# Patient Record
Sex: Female | Born: 1985 | Race: Black or African American | Hispanic: No | Marital: Single | Smoking: Never smoker
Health system: Southern US, Community
[De-identification: ages and names within clinical notes are randomized; demographics above are authoritative.]

## PROBLEM LIST (undated history)

## (undated) DIAGNOSIS — T7840XA Allergy, unspecified, initial encounter: Secondary | ICD-10-CM

## (undated) DIAGNOSIS — F329 Major depressive disorder, single episode, unspecified: Secondary | ICD-10-CM

## (undated) DIAGNOSIS — J45909 Unspecified asthma, uncomplicated: Secondary | ICD-10-CM

## (undated) DIAGNOSIS — F32A Depression, unspecified: Secondary | ICD-10-CM

## (undated) HISTORY — PX: BREAST SURGERY: SHX581

## (undated) HISTORY — DX: Depression, unspecified: F32.A

## (undated) HISTORY — DX: Allergy, unspecified, initial encounter: T78.40XA

## (undated) HISTORY — DX: Major depressive disorder, single episode, unspecified: F32.9

## (undated) HISTORY — DX: Unspecified asthma, uncomplicated: J45.909

---

## 2012-12-07 ENCOUNTER — Ambulatory Visit: Payer: BC Managed Care – PPO

## 2012-12-07 ENCOUNTER — Ambulatory Visit (INDEPENDENT_AMBULATORY_CARE_PROVIDER_SITE_OTHER): Payer: BC Managed Care – PPO | Admitting: Family Medicine

## 2012-12-07 VITALS — BP 122/74 | HR 118 | Temp 100.0°F | Resp 16 | Ht 64.0 in | Wt 198.0 lb

## 2012-12-07 DIAGNOSIS — J209 Acute bronchitis, unspecified: Secondary | ICD-10-CM

## 2012-12-07 DIAGNOSIS — R0602 Shortness of breath: Secondary | ICD-10-CM

## 2012-12-07 DIAGNOSIS — J029 Acute pharyngitis, unspecified: Secondary | ICD-10-CM

## 2012-12-07 DIAGNOSIS — R509 Fever, unspecified: Secondary | ICD-10-CM

## 2012-12-07 LAB — POCT RAPID STREP A (OFFICE): Rapid Strep A Screen: NEGATIVE

## 2012-12-07 LAB — POCT CBC
Granulocyte percent: 86.2 % — AB (ref 37–80)
HCT, POC: 35.4 % — AB (ref 37.7–47.9)
Hemoglobin: 11.1 g/dL — AB (ref 12.2–16.2)
Lymph, poc: 1.3 (ref 0.6–3.4)
MCH, POC: 25.9 pg — AB (ref 27–31.2)
MCHC: 31.4 g/dL — AB (ref 31.8–35.4)
MCV: 82.5 fL (ref 80–97)
MID (cbc): 0.6 (ref 0–0.9)
MPV: 9.3 fL (ref 0–99.8)
POC Granulocyte: 11.6 — AB (ref 2–6.9)
POC LYMPH PERCENT: 9.4 %L — AB (ref 10–50)
POC MID %: 4.4 % (ref 0–12)
Platelet Count, POC: 312 10*3/uL (ref 142–424)
RBC: 4.29 M/uL (ref 4.04–5.48)
RDW, POC: 17.4 %
WBC: 13.4 10*3/uL — AB (ref 4.6–10.2)

## 2012-12-07 LAB — POCT UA - MICROSCOPIC ONLY
Casts, Ur, LPF, POC: NEGATIVE
Crystals, Ur, HPF, POC: NEGATIVE
Yeast, UA: NEGATIVE

## 2012-12-07 LAB — POCT URINALYSIS DIPSTICK
Bilirubin, UA: NEGATIVE
Glucose, UA: NEGATIVE
Ketones, UA: 15
Nitrite, UA: NEGATIVE
Protein, UA: NEGATIVE
Spec Grav, UA: 1.025
Urobilinogen, UA: 0.2
pH, UA: 6

## 2012-12-07 LAB — POCT INFLUENZA A/B
Influenza A, POC: NEGATIVE
Influenza B, POC: NEGATIVE

## 2012-12-07 MED ORDER — AZITHROMYCIN 250 MG PO TABS: ORAL_TABLET | ORAL | Status: DC

## 2012-12-07 NOTE — Progress Notes (Signed)
[facility]:  Office Visit  Chief Complaint:  Chief Complaint  Patient presents with  . Fever    x 1 day  . Generalized Body Aches    x 1 day     HPI: Sylvia Parker is a 27 y.o. female who complains of  Sore throat, body aches, nausea, stomach upset, SOB and wheezing x 1 day. + fever. Tried Nyquil without relief.  No cough, no leg swelling, not on birth control. No facial pain or drainage. Risk factor for DVT is that she traveled to [place] by plane  last week, Just moved from [place] from [place]. Recently taking more albuterol. Has not tried any tylenol or motrin   Past Medical History  Diagnosis Date  . Allergy   . Asthma    Past Surgical History  Procedure Laterality Date  . Breast surgery     History   Social History  . Marital Status: Single    Spouse Name: N/A    Number of Children: N/A  . Years of Education: N/A   Social History Main Topics  . Smoking status: Never Smoker   . Smokeless tobacco: None  . Alcohol Use: No  . Drug Use: No  . Sexually Active: None   Other Topics Concern  . None   Social History Narrative  . None   Family History  Problem Relation Age of Onset  . Kidney failure Father   . Cancer Maternal Grandfather   . Cancer Paternal Grandfather    No Known Allergies Prior to Admission medications   Medication Sig Start Date End Date Taking? Authorizing Provider  albuterol (PROVENTIL HFA;VENTOLIN HFA) 108 (90 BASE) MCG/ACT inhaler Inhale 2 puffs into the lungs as needed for wheezing.   Yes Historical Provider, MD     ROS: The patient denies chills, night sweats, unintentional weight loss, chest pain, palpitations, vomiting, abdominal pain, dysuria, hematuria, melena, numbness,  or tingling.  All other systems have been reviewed and were otherwise negative with the exception of those mentioned in the HPI and as above.    PHYSICAL EXAM: Filed Vitals:   12/07/12 0840  BP: 122/74  Pulse: 118  Temp: 100 F  (37.8 C)  Resp: 16  Spo2       96% Filed Vitals:   12/07/12 0840  Height: 5\' 4"  (1.626 m)  Weight: 198 lb (89.812 kg)   Body mass index is 33.97 kg/(m^2).  General: Alert, no acute distress, obese AA female HEENT:  Normocephalic, atraumatic, oropharynx patent. No exudates, TM nl. EOMI, PERRLA Cardiovascular:  Regular rate and rhythm, no rubs murmurs or gallops.  No Carotid bruits, radial pulse intact. No pedal edema.  Respiratory: Clear to auscultation bilaterally.  No wheezes, rales, or rhonchi.  No cyanosis, no use of accessory musculature GI: No organomegaly, abdomen is soft and non-tender, positive bowel sounds.  No masses. Skin: No rashes. Neurologic: Facial musculature symmetric. Psychiatric: Patient is appropriate throughout our interaction. Lymphatic: No cervical lymphadenopathy Musculoskeletal: Gait intact.   LABS: Results for orders placed in visit on 12/07/12  POCT INFLUENZA A/B      Result Value Range   Influenza A, POC Negative     Influenza B, POC Negative    POCT RAPID STREP A (OFFICE)      Result Value Range   Rapid Strep A Screen Negative  Negative  POCT CBC      Result Value Range   WBC 13.4 (*) 4.6 - 10.2 K/uL   Lymph,  poc 1.3  0.6 - 3.4   POC LYMPH PERCENT 9.4 (*) 10 - 50 %L   MID (cbc) 0.6  0 - 0.9   POC MID % 4.4  0 - 12 %M   POC Granulocyte 11.6 (*) 2 - 6.9   Granulocyte percent 86.2 (*) 37 - 80 %G   RBC 4.29  4.04 - 5.48 M/uL   Hemoglobin 11.1 (*) 12.2 - 16.2 g/dL   HCT, POC 16.1 (*) 09.6 - 47.9 %   MCV 82.5  80 - 97 fL   MCH, POC 25.9 (*) 27 - 31.2 pg   MCHC 31.4 (*) 31.8 - 35.4 g/dL   RDW, POC 04.5     Platelet Count, POC 312  142 - 424 K/uL   MPV 9.3  0 - 99.8 fL     EKG/XRAY:   Primary read interpreted by Dr. Conley Rolls at [facility] + increase vasc marking, bronchitic changes   ASSESSMENT/PLAN: Encounter Diagnoses  Name Primary?  . Fever, unspecified   . Acute pharyngitis   . SOB (shortness of breath)   . Acute bronchitis Yes   Repeat  Temp after getting motrin 600 mgx 1 in office. Temp 99.7 1. Rx azithromycin 2. Monitor for worsening sxs, d/w s/sx of DVT 3. Ibuprofen and MOtrin 4. C/w albuterol prn Gross sideeffects, risk and benefits, and alternatives of medications d/w patient. Patient is aware that all medications have potential sideeffects and we are unable to predict every sideeffect or drug-drug interaction that may occur. Work note given for her partner Lavina Hamman for today off 12/07/2012. F/u prn if worsening sxs. Go to ER prn    Helyn Schwan PHUONG, DO 12/07/2012 10:56 AM

## 2012-12-07 NOTE — Patient Instructions (Signed)

## 2014-04-20 ENCOUNTER — Ambulatory Visit (INDEPENDENT_AMBULATORY_CARE_PROVIDER_SITE_OTHER): Payer: BC Managed Care – PPO | Admitting: Psychiatry

## 2014-04-20 ENCOUNTER — Encounter (HOSPITAL_COMMUNITY): Payer: Self-pay | Admitting: Psychiatry

## 2014-04-20 ENCOUNTER — Encounter (INDEPENDENT_AMBULATORY_CARE_PROVIDER_SITE_OTHER): Payer: Self-pay

## 2014-04-20 VITALS — BP 135/73 | HR 89 | Ht 62.0 in | Wt 203.4 lb

## 2014-04-20 DIAGNOSIS — F411 Generalized anxiety disorder: Secondary | ICD-10-CM

## 2014-04-20 DIAGNOSIS — Z0000 Encounter for general adult medical examination without abnormal findings: Secondary | ICD-10-CM

## 2014-04-20 DIAGNOSIS — F5105 Insomnia due to other mental disorder: Secondary | ICD-10-CM | POA: Insufficient documentation

## 2014-04-20 DIAGNOSIS — F331 Major depressive disorder, recurrent, moderate: Secondary | ICD-10-CM

## 2014-04-20 DIAGNOSIS — F418 Other specified anxiety disorders: Secondary | ICD-10-CM | POA: Insufficient documentation

## 2014-04-20 MED ORDER — SERTRALINE HCL 50 MG PO TABS: 50.0000 mg | ORAL_TABLET | Freq: Every day | ORAL | Status: DC

## 2014-04-20 MED ORDER — HYDROXYZINE PAMOATE 25 MG PO CAPS: ORAL_CAPSULE | ORAL | Status: DC

## 2014-04-20 NOTE — Progress Notes (Signed)
Psychiatric Assessment Adult  Patient Identification:  Sylvia Parker Date of Evaluation:  04/20/2014 Chief Complaint: I am depressed History of Chief Complaint:   Chief Complaint  Patient presents with  . Depression    HPI Comments: States she has been depressed for years but it is getting worse lately. She is tired of feeling this way and is looking for help. Typically deals with it by pushing it to the back of her mind and ignoring it. Her dad passed in 2007 and since then it has been harder to ignore. She has never been treated for depression. Pt is depressed near daily and has trouble sometimes getting out of bed. One day a week she tries to get ouf the house and it helps her to feel better. Reports low motivation, anhedonia, isolation, withdrawn, crying spells. Reports worthlessness and low self esteem. Sleeping about 3 hrs/night. Appetite and energy are low. Denies SI/HI, AVH.   Review of Systems Physical Exam  Psychiatric: Her behavior is normal. Judgment and thought content normal. Her mood appears anxious. Cognition and memory are normal. She exhibits a depressed mood.    Depressive Symptoms: depressed mood, anhedonia, insomnia, fatigue, feelings of worthlessness/guilt, difficulty concentrating, hopelessness, decreased appetite,  (Hypo) Manic Symptoms:   Elevated Mood:  No Irritable Mood:  Yes Grandiosity:  No Distractibility:  No Labiality of Mood:  No Delusions:  No Hallucinations:  No Impulsivity:  No Sexually Inappropriate Behavior:  No Financial Extravagance:  No Flight of Ideas:  No  Anxiety Symptoms: Excessive Worry:  Yes racing thoughts and everything makes her nervous. Interfers with sleep and causes fatigue, GI upset and headaches. States she has 5000 thoughts in her head and she can't focus on anything. Causes palpations and sweating.  Panic Symptoms:  No Agoraphobia:  No Obsessive Compulsive: No  Symptoms: None, Specific Phobias:  No Social  Anxiety:  No  Psychotic Symptoms:  Hallucinations: No None Delusions:  No Paranoia:  Yes  Feels that people are watching her and whispering about her. Thinks they think that she is fat, ugly and wears ugle clothes.  Ideas of Reference:  No  PTSD Symptoms: Ever had a traumatic exposure:  No Had a traumatic exposure in the last month:  No Re-experiencing: No None Hypervigilance:  No Hyperarousal: No None Avoidance: No None  Traumatic Brain Injury: No   Past Psychiatric History: Diagnosis: deniies  Hospitalizations: denies  Outpatient Care: denies  Substance Abuse Care: denies  Self-Mutilation: denies  Suicidal Attempts: 10 yrs ago she took a bottle of pain killers and had stomach pumped. Denies access to guns  Violent Behaviors: denies   Past Medical History:   Past Medical History  Diagnosis Date  . Allergy   . Asthma    History of Loss of Consciousness:  No Seizure History:  No Cardiac History:  No Allergies:  No Known Allergies Current Medications:  Current Outpatient Prescriptions  Medication Sig Dispense Refill  . albuterol (PROVENTIL HFA;VENTOLIN HFA) 108 (90 BASE) MCG/ACT inhaler Inhale 2 puffs into the lungs as needed for wheezing.      . pregabalin (LYRICA) 150 MG capsule Take 150 mg by mouth 2 (two) times daily.      Marland Kitchen. azithromycin (ZITHROMAX) 250 MG tablet Take 2 tabs PO now then 1 tab PO daily for the next 4 days  6 tablet  0   No current facility-administered medications for this visit.    Previous Psychotropic Medications:  Medication Dose   denies  Substance Abuse History in the last 12 months: Substance Age of 1st Use Last Use Amount Specific Type  Nicotine  denies        Alcohol  denies        Cannabis  denies        Opiates  denies        Cocaine  denies        Methamphetamines  denies        LSD  denies        Ecstasy  denies         Benzodiazepines  denies        Caffeine     1 coffee 2x/week    Inhalants   denies        Others:  denies                         Medical Consequences of Substance Abuse: denies  Legal Consequences of Substance Abuse: denies  Family Consequences of Substance Abuse: denies  Blackouts:  No DT's:  No Withdrawal Symptoms:  No None  Social History: Current Place of Residence: [place] lives with partner Place of Birth: [place] Family Members: raised by mom and dad separately. Parents divorced when she was 7. Has 2 brother and 2 sisters. Pt is in the middle. States it was hard b/c her dad wasn't there and mom was depressed.  Marital Status:  in relationship for 3 yrs.  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 Legal History: denies Hobbies/Interests: denies  Family History:   Family History  Problem Relation Age of Onset  . Kidney failure Father   . Cancer Maternal Grandfather   . Cancer Paternal Grandfather   . Depression Mother   . Suicidality Neg Hx   . Seizures Neg Hx   . Schizophrenia Neg Hx   . Anxiety disorder Neg Hx     Mental Status Examination/Evaluation: Objective: Attitude: Calm and cooperative  Appearance: Fairly Groomed, appears to be stated age  Patent attorney::  Fair  Speech:  Clear and Coherent and Normal Rate  Volume:  Decreased  Mood:  depressed  Affect:  Congruent and Tearful  Thought Process:  Goal Directed, Linear and Logical  Orientation:  Full (Time, Place, and Person)  Thought Content:  WDL  Suicidal Thoughts:  No  Homicidal Thoughts:  No  Judgement:  Fair  Insight:  Fair  Concentration: good  Memory: Immediate-intact Recent-intact Remote-intact  Recall: fair  Language: fair  Gait and Station: normal  [employer] of Knowledge: average  Psychomotor  Activity:  Normal  Akathisia:  No  Handed:  Right  AIMS (if indicated):  n/a  Assets:  Communication Skills Desire for Improvement Financial Resources/Insurance Housing Intimacy Leisure Time Physical Health Resilience Talents/Skills Transportation Vocational/Educational      Laboratory/X-Ray Psychological Evaluation(s)   order labs none     Assessment:  MDD- moderate, recurrent; GAD  AXIS I Generalized Anxiety Disorder and Major Depression, Recurrent moderate  AXIS II Deferred  AXIS III Past Medical History  Diagnosis Date  . Allergy   . Asthma      AXIS IV other psychosocial or environmental problems and problems with primary support group  AXIS V 51-60 moderate symptoms   Treatment Plan/Recommendations:  Plan of Care:  Medication  management with supportive therapy. Risks/benefits and SE of the medication discussed. Pt verbalized understanding and verbal consent obtained for treatment.  Affirm with the patient that the medications are taken as ordered. Patient expressed understanding of how their medications were to be used.   Confidentiality and exclusions reviewed with pt who verbalized understanding.   Laboratory:  CBC Chemistry Profile HbAIC UDS Vitamin B-12 TSH  Psychotherapy: Therapy: brief supportive therapy provided. Discussed psychosocial stressors in detail.     Medications: Start trial of Zoloft 50mg  po qD for depression and anxiety Start trial of Vistaril 25-50mg  po qHS prn insomnia   Routine PRN Medications:  Yes  Consultations: encouraged to continue individual therapy with Dr. Everardo PacificArlene Cannon every 2 weeks  Encouraged pt to establish care with PCP  Safety Concerns:  Pt denies SI and is at an acute low risk for suicide.Patient told to call clinic if any problems occur. Patient advised to go to ER if they should develop SI/HI, side effects, or if symptoms worsen. Has crisis numbers to call if needed. Pt verbalized understanding.   Other:  F/up  in 2 months or sooner if needed     Oletta DarterAGARWAL, Shaylah Mcghie, MD 10/13/20159:15 AM

## 2014-06-22 ENCOUNTER — Ambulatory Visit (HOSPITAL_COMMUNITY): Payer: BC Managed Care – PPO | Admitting: Psychiatry

## 2014-06-28 ENCOUNTER — Other Ambulatory Visit (HOSPITAL_COMMUNITY): Payer: Self-pay | Admitting: Psychiatry

## 2014-07-08 ENCOUNTER — Encounter (HOSPITAL_COMMUNITY): Payer: Self-pay | Admitting: Psychiatry

## 2014-07-08 ENCOUNTER — Ambulatory Visit (INDEPENDENT_AMBULATORY_CARE_PROVIDER_SITE_OTHER): Payer: BC Managed Care – PPO | Admitting: Psychiatry

## 2014-07-08 VITALS — BP 122/81 | HR 85 | Ht 62.0 in | Wt 199.4 lb

## 2014-07-08 DIAGNOSIS — F331 Major depressive disorder, recurrent, moderate: Secondary | ICD-10-CM

## 2014-07-08 DIAGNOSIS — F411 Generalized anxiety disorder: Secondary | ICD-10-CM

## 2014-07-08 MED ORDER — SERTRALINE HCL 100 MG PO TABS: 100.0000 mg | ORAL_TABLET | Freq: Every day | ORAL | Status: DC

## 2014-07-08 NOTE — Progress Notes (Signed)
[facility] [id number] Progress Note  Sylvia Parker [id number] 28 y.o.  07/08/2014 8:41 AM  Chief Complaint: anxiety is not good  History of Present Illness: Pt had several stressful event that happened at the ned of November. Went to [place] and it was hard with family and seperated temporally from wife. Work remains stressful. States it was causing significant stress, decreased appetite.  Today depression is slowly improving. States meds are helping. Reports crying spells are less frequent. Reports pt is less emotional and less sensitive. At least 1-2x/week she has sad mood. Reports on going anhedonia. Pt is making an effort to be more social. Reports less feelings of worthlessness and hopelessness. Sleep has improved and she is taking Vistaril 3-4x/week and she is getting about 7 hrs. Energy is good. Concentration remains poor but is improving. Appetite is decreased and she is eating once/day. States that is her usual response to stress.   Anxiety is unchanged and she has ongoing racing thoughts.   Taking Zoloft as prescribed and denies SE.  Suicidal Ideation: No Plan Formed: No Patient has means to carry out plan: No  Homicidal Ideation: No Plan Formed: No Patient has means to carry out plan: No  Review of Systems: Psychiatric: Agitation: No Hallucination: No Depressed Mood: Yes Insomnia: No Hypersomnia: No Altered Concentration: Yes Feels Worthless: Yes Grandiose Ideas: No Belief In Special Powers: No New/Increased Substance Abuse: Yes now smoking THC several times a week Compulsions: No  Neurologic: Headache: No Seizure: No Paresthesias: No   Review of Systems  Constitutional: Positive for weight loss. Negative for fever and chills.  HENT: Negative for congestion, nosebleeds and sore throat.   Eyes: Negative for blurred vision, double vision and redness.  Respiratory: Negative for cough, sputum production and shortness of breath.   Cardiovascular:  Negative for chest pain, palpitations and leg swelling.  Gastrointestinal: Negative for heartburn, nausea, vomiting and abdominal pain.  Musculoskeletal: Negative for myalgias, back pain and neck pain.  Skin: Negative for itching and rash.  Neurological: Negative for dizziness, tingling, seizures, loss of consciousness, weakness and headaches.  Psychiatric/Behavioral: Positive for depression. Negative for suicidal ideas, hallucinations and substance abuse. The patient is nervous/anxious. The patient does not have insomnia.      Past Medical Family, Social History: living with wife. No kids. Working at [employer] for last 4 yrs. raised by mom and dad separately. Parents divorced when she was 7. Has 2 brother and 2 sisters. Pt is in the middle. States it was hard b/c her dad wasn't there and mom was depressed.  reports that she has never smoked. She has never used smokeless tobacco. She reports that she drinks alcohol. She reports that she uses illicit drugs (Marijuana).  Family History  Problem Relation Age of Onset  . Kidney failure Father   . Cancer Maternal Grandfather   . Cancer Paternal Grandfather   . Depression Mother   . Suicidality Neg Hx   . Seizures Neg Hx   . Schizophrenia Neg Hx   . Anxiety disorder Neg Hx     Past Medical History  Diagnosis Date  . Allergy   . Asthma    Outpatient Encounter Prescriptions as of 07/08/2014  Medication Sig  . albuterol (PROVENTIL HFA;VENTOLIN HFA) 108 (90 BASE) MCG/ACT inhaler Inhale 2 puffs into the lungs as needed for wheezing.  . hydrOXYzine (VISTARIL) 25 MG capsule Take 25-50mg  po qHS prn insomnia  . pregabalin (LYRICA) 150 MG capsule Take 150 mg by mouth 2 (  two) times daily.  . sertraline (ZOLOFT) 50 MG tablet TAKE 1 TABLET BY MOUTH EVERY DAY    Past Psychiatric History/Hospitalization(s): Anxiety: No Bipolar Disorder: No Depression: No Mania: No Psychosis: No Schizophrenia: No Personality Disorder:  No Hospitalization for psychiatric illness: No History of Electroconvulsive Shock Therapy: No Prior Suicide Attempts: Yes  Physical Exam: Constitutional:  BP 122/81 mmHg  Pulse 85  Ht 5\' 2"[id number]$  (1.575 m)  Wt 199 lb 6.4 oz (90.447 kg)  BMI 36.46 kg/m2  General Appearance: alert, oriented, no acute distress  Musculoskeletal: Strength & Muscle Tone: within normal limits Gait & Station: normal Patient leans: N/A  Mental Status Examination/Evaluation: Objective: Attitude: Calm and cooperative  Appearance: Fairly Groomed and Neat, appears to be stated age  Eye Contact::  Good  Speech:  Clear and Coherent and Normal Rate  Volume:  Normal  Mood:  depressed  Affect:  Full Range  Thought Process:  Goal Directed, Linear and Logical  Orientation:  Full (Time, Place, and Person)  Thought Content:  Negative  Suicidal Thoughts:  No  Homicidal Thoughts:  No  Judgement:  Good  Insight:  Good  Concentration: good  Memory: Immediate-good Recent-good Remote-good  Recall: fair  Language: fair  Gait and Station: normal  [employer] of Knowledge: average  Psychomotor Activity:  Normal  Akathisia:  No  Handed:  Right  AIMS (if indicated): n/a  Assets:  Communication Skills Desire for Improvement Housing Intimacy Social Support Location managerTalents/Skills Transportation Vocational/Educational       Medical Decision Making (Choose Three): Established Problem, Stable/Improving (1), Review of Psycho-Social Stressors (1), Review or order clinical lab tests (1), Established Problem, Worsening (2), Review of Medication Regimen & Side Effects (2) and Review of New Medication or Change in Dosage (2)  Assessment: AXIS I Generalized Anxiety Disorder and Major Depression, Recurrent moderate  AXIS II Deferred  AXIS III Past Medical History  Diagnosis Date  . Allergy   . Asthma      AXIS IV other psychosocial or environmental problems and problems with primary support group  AXIS  V 51-60 moderate symptoms   Treatment Plan/Recommendations:  Plan of Care: Medication management with supportive therapy. Risks/benefits and SE of the medication discussed. Pt verbalized understanding and verbal consent obtained for treatment. Affirm with the patient that the medications are taken as ordered. Patient expressed understanding of how their medications were to be used.    Laboratory: pending  CBC Chemistry Profile HbAIC UDS Vitamin B-12 TSH  Psychotherapy: Therapy: brief supportive therapy provided. Discussed psychosocial stressors in detail.    Medications: Increase Zoloft to 100mg  po qD for depression and anxiety  Vistaril 25-50mg  po qHS prn insomnia   Routine PRN Medications: Yes  Consultations: encouraged to continue individual therapy with Dr. Everardo PacificArlene Cannon every 2 weeks- going well  Encouraged pt to establish care with PCP  Safety Concerns: Pt denies SI and is at an acute low risk for suicide.Patient told to call clinic if any problems occur. Patient advised to go to ER if they should develop SI/HI, side effects, or if symptoms worsen. Has crisis numbers to call if needed. Pt verbalized understanding.   Other: F/up in 3 months or sooner if needed    Oletta DarterAGARWAL, Sylvia Mcinnis, MD 07/08/2014

## 2014-10-07 ENCOUNTER — Ambulatory Visit (INDEPENDENT_AMBULATORY_CARE_PROVIDER_SITE_OTHER): Payer: BLUE CROSS/BLUE SHIELD | Admitting: Psychiatry

## 2014-10-07 ENCOUNTER — Encounter (HOSPITAL_COMMUNITY): Payer: Self-pay | Admitting: Psychiatry

## 2014-10-07 VITALS — BP 122/90 | HR 96 | Ht 62.5 in | Wt 199.8 lb

## 2014-10-07 DIAGNOSIS — F5105 Insomnia due to other mental disorder: Secondary | ICD-10-CM

## 2014-10-07 DIAGNOSIS — F331 Major depressive disorder, recurrent, moderate: Secondary | ICD-10-CM

## 2014-10-07 DIAGNOSIS — F411 Generalized anxiety disorder: Secondary | ICD-10-CM

## 2014-10-07 DIAGNOSIS — F418 Other specified anxiety disorders: Secondary | ICD-10-CM

## 2014-10-07 MED ORDER — HYDROXYZINE PAMOATE 25 MG PO CAPS: ORAL_CAPSULE | ORAL | Status: DC

## 2014-10-07 MED ORDER — SERTRALINE HCL 100 MG PO TABS: 150.0000 mg | ORAL_TABLET | Freq: Every day | ORAL | Status: DC

## 2014-10-07 MED ORDER — BUSPIRONE HCL 5 MG PO TABS: 5.0000 mg | ORAL_TABLET | Freq: Two times a day (BID) | ORAL | Status: DC

## 2014-10-07 NOTE — Progress Notes (Signed)
Patient ID: Sylvia Parker, female   DOB: Aug 11, 1985, 29 y.o.   MRN: [id number]  [facility] [id number] Progress Note  K. Elan Mcelvain [id number] 29 y.o.  10/07/2014 8:36 AM  Chief Complaint: anxiety is getting worse  History of Present Illness: Pt reports stress tolerance is low. Irritability and anxiety are increased. Pt is having nervous feeling, distraction, inability to concentrate, sweating, GI upset and restlessness. These feelings are occurring almost daily.  Pt is having stress induced panic attacks at least once a week.   Today states depression is stable. States meds are helping. Reports crying spells are less frequent. Reports pt is less emotional and less sensitive. At least 1-2x/week she has sad mood. Reports on going anhedonia. Pt is making an effort to be more social. Reports less feelings of worthlessness and hopelessness. Sleep has improved and she is taking Vistaril 3-4x/week and she is getting about 7 hrs. Energy is good. Appetite is decreased even more and she is eating once/day. States that is her usual response to stress.   Taking Zoloft as prescribed and denies SE.   Suicidal Ideation: No Plan Formed: No Patient has means to carry out plan: No  Homicidal Ideation: No Plan Formed: No Patient has means to carry out plan: No  Review of Systems: Psychiatric: Agitation: Yes Hallucination: No Depressed Mood: Yes Insomnia: No Hypersomnia: No Altered Concentration: Yes Feels Worthless: Yes Grandiose Ideas: No Belief In Special Powers: No New/Increased Substance Abuse: No now smoking THC several times a week Compulsions: No  Neurologic: Headache: Yes Seizure: No Paresthesias: No   Review of Systems  Constitutional: Negative for fever, chills and malaise/fatigue.  HENT: Negative for congestion, ear pain, hearing loss, nosebleeds and sore throat.   Eyes: Negative for blurred vision, double vision, photophobia and redness.  Respiratory:  Positive for wheezing. Negative for cough and shortness of breath.   Cardiovascular: Positive for palpitations. Negative for chest pain and leg swelling.  Gastrointestinal: Positive for abdominal pain. Negative for heartburn, nausea and vomiting.  Musculoskeletal: Negative for back pain, joint pain and neck pain.  Skin: Negative for itching and rash.  Neurological: Positive for headaches. Negative for dizziness, tremors, sensory change, seizures and loss of consciousness.  Psychiatric/Behavioral: Positive for depression. Negative for suicidal ideas, hallucinations and substance abuse. The patient is not nervous/anxious.      Past Medical Family, Social History: living with wife. No kids. Working at [employer] for last 4 yrs. raised by mom and dad separately. Parents divorced when she was 7. Has 2 brother and 2 sisters. Pt is in the middle. States it was hard b/c her dad wasn't there and mom was depressed.  reports that she has never smoked. She has never used smokeless tobacco. She reports that she drinks alcohol. She reports that she uses illicit drugs (Marijuana).  Family History  Problem Relation Age of Onset  . Kidney failure Father   . Cancer Maternal Grandfather   . Cancer Paternal Grandfather   . Depression Mother   . Suicidality Neg Hx   . Seizures Neg Hx   . Schizophrenia Neg Hx   . Anxiety disorder Neg Hx     Past Medical History  Diagnosis Date  . Allergy   . Asthma   . Depression    Outpatient Encounter Prescriptions as of 10/07/2014  Medication Sig  . albuterol (PROVENTIL HFA;VENTOLIN HFA) 108 (90 BASE) MCG/ACT inhaler Inhale 2 puffs into the lungs as needed for wheezing.  . hydrOXYzine (VISTARIL) 25  MG capsule Take 25-50mg  po qHS prn insomnia  . pregabalin (LYRICA) 150 MG capsule Take 150 mg by mouth 2 (two) times daily.  . sertraline (ZOLOFT) 100 MG tablet Take 1 tablet (100 mg total) by mouth daily.    Past Psychiatric  History/Hospitalization(s): Anxiety: No Bipolar Disorder: No Depression: No Mania: No Psychosis: No Schizophrenia: No Personality Disorder: No Hospitalization for psychiatric illness: No History of Electroconvulsive Shock Therapy: No Prior Suicide Attempts: Yes  Physical Exam: Constitutional:  BP 122/90 mmHg  Pulse 96  Ht 5' 2.5" (1.588 m)  Wt 199 lb 12.8 oz (90.629 kg)  BMI 35.94 kg/m2  General Appearance: alert, oriented, no acute distress  Musculoskeletal: Strength & Muscle Tone: within normal limits Gait & Station: normal Patient leans: N/A  Mental Status Examination/Evaluation: Objective: Attitude: Calm and cooperative  Appearance: Fairly Groomed and Neat, appears to be stated age  Eye Contact::  Good  Speech:  Clear and Coherent and Normal Rate  Volume:  Normal  Mood: anxious  Affect:  Full Range  Thought Process:  Goal Directed, Linear and Logical  Orientation:  Full (Time, Place, and Person)  Thought Content:  Negative  Suicidal Thoughts:  No  Homicidal Thoughts:  No  Judgement:  Good  Insight:  Good  Concentration: good  Memory: Immediate-good Recent-good Remote-good  Recall: fair  Language: fair  Gait and Station: normal  [employer] of Knowledge: average  Psychomotor Activity:  Normal  Akathisia:  No  Handed:  Right  AIMS (if indicated): n/a  Assets:  Communication Skills Desire for Improvement Housing Intimacy Social Support Location managerTalents/Skills Transportation Vocational/Educational       Medical Decision Making (Choose Three): Review of Psycho-Social Stressors (1), Review or order clinical lab tests (1), Established Problem, Worsening (2), Review of Medication Regimen & Side Effects (2) and Review of New Medication or Change in Dosage (2)  Assessment: AXIS I Generalized Anxiety Disorder and Major Depression, Recurrent moderate  AXIS II Deferred  AXIS III Past Medical History  Diagnosis Date  . Allergy   . Asthma       AXIS IV other psychosocial or environmental problems and problems with primary support group  AXIS V 51-60 moderate symptoms   Treatment Plan/Recommendations:  Plan of Care: Medication management with supportive therapy. Risks/benefits and SE of the medication discussed. Pt verbalized understanding and verbal consent obtained for treatment. Affirm with the patient that the medications are taken as ordered. Patient expressed understanding of how their medications were to be used.    Laboratory: pt reminded to get labs before next visit  CBC Chemistry Profile HbAIC UDS Vitamin B-12 TSH  Psychotherapy: Therapy: brief supportive therapy provided. Discussed psychosocial stressors in detail.    Medications: Increase Zoloft to 150mg  po qD for depression and anxiety Start trial of Buspar 5mg  po BID  Vistaril 25-50mg  po qHS prn insomnia   Routine PRN Medications: Yes  Consultations: encouraged to continue individual therapy with Dr. Everardo PacificArlene Cannon every 2 weeks- going well  Encouraged pt to establish care with PCP  Safety Concerns: Pt denies SI and is at an acute low risk for suicide.Patient told to call clinic if any problems occur. Patient advised to go to ER if they should develop SI/HI, side effects, or if symptoms worsen. Has crisis numbers to call if needed. Pt verbalized understanding.   Other: F/up in 3 months or sooner if needed    Oletta DarterAGARWAL, Karielle Davidow, MD 10/07/2014

## 2014-12-14 ENCOUNTER — Emergency Department (HOSPITAL_BASED_OUTPATIENT_CLINIC_OR_DEPARTMENT_OTHER): Payer: BLUE CROSS/BLUE SHIELD

## 2014-12-14 ENCOUNTER — Emergency Department (HOSPITAL_BASED_OUTPATIENT_CLINIC_OR_DEPARTMENT_OTHER)
Admission: EM | Admit: 2014-12-14 | Discharge: 2014-12-14 | Disposition: A | Discharge status: Home / Self Care | Payer: BLUE CROSS/BLUE SHIELD | Attending: Emergency Medicine | Admitting: Emergency Medicine

## 2014-12-14 ENCOUNTER — Encounter (HOSPITAL_BASED_OUTPATIENT_CLINIC_OR_DEPARTMENT_OTHER): Payer: Self-pay | Admitting: *Deleted

## 2014-12-14 DIAGNOSIS — Z79899 Other long term (current) drug therapy: Secondary | ICD-10-CM | POA: Insufficient documentation

## 2014-12-14 DIAGNOSIS — Y9289 Other specified places as the place of occurrence of the external cause: Secondary | ICD-10-CM | POA: Diagnosis not present

## 2014-12-14 DIAGNOSIS — S99911A Unspecified injury of right ankle, initial encounter: Secondary | ICD-10-CM | POA: Diagnosis present

## 2014-12-14 DIAGNOSIS — S93401A Sprain of unspecified ligament of right ankle, initial encounter: Secondary | ICD-10-CM

## 2014-12-14 DIAGNOSIS — J45909 Unspecified asthma, uncomplicated: Secondary | ICD-10-CM | POA: Diagnosis not present

## 2014-12-14 DIAGNOSIS — F329 Major depressive disorder, single episode, unspecified: Secondary | ICD-10-CM | POA: Diagnosis not present

## 2014-12-14 DIAGNOSIS — W1839XA Other fall on same level, initial encounter: Secondary | ICD-10-CM | POA: Insufficient documentation

## 2014-12-14 DIAGNOSIS — Y9389 Activity, other specified: Secondary | ICD-10-CM | POA: Diagnosis not present

## 2014-12-14 DIAGNOSIS — Y998 Other external cause status: Secondary | ICD-10-CM | POA: Diagnosis not present

## 2014-12-14 MED ORDER — OXYCODONE-ACETAMINOPHEN 5-325 MG PO TABS
2.0000 | ORAL_TABLET | Freq: Once | ORAL | Status: AC
  Administered 2014-12-14: 2 via ORAL
  Filled 2014-12-14: qty 2

## 2014-12-14 MED ORDER — NAPROXEN 500 MG PO TABS: 500.0000 mg | ORAL_TABLET | Freq: Two times a day (BID) | ORAL | Status: DC

## 2014-12-14 MED ORDER — OXYCODONE-ACETAMINOPHEN 5-325 MG PO TABS: 2.0000 | ORAL_TABLET | ORAL | Status: DC | PRN

## 2014-12-14 NOTE — ED Provider Notes (Signed)
CSN: [id number]     Arrival date & time 12/14/14  2100 History   First MD Initiated Contact with Patient 12/14/14 2146     Chief Complaint  Patient presents with  . Fall  . Ankle Pain     (Consider location/radiation/quality/duration/timing/severity/associated sxs/prior Treatment) Patient is a 29 y.o. female presenting with fall and ankle pain. The history is provided by the patient. No language interpreter was used.  Fall Associated symptoms include arthralgias and myalgias. Pertinent negatives include no neck pain, numbness, rash or weakness.  Ankle Pain Associated symptoms: no back pain and no neck pain    Sylvia Parker is a 29 year old female with a history of asthma, depression who presents for ankle pain after stepping into a divot in the grass while chasing her dog at 11:45 AM. She states her foot inverted and then she fell to the ground. She was able to walk after the incident and did not lose consciousness or hit her head but did hobble back to the house. She states that she did not believe she broke her ankle but the pain increased and the swelling in her foot also increased as the day went on. She states the pain is worse with ambulating. She has had no treatment prior to arriving in the ED. She denies any neck pain any back pain any dizziness any head injury, numbness, tingling.  Past Medical History  Diagnosis Date  . Allergy   . Asthma   . Depression    Past Surgical History  Procedure Laterality Date  . Breast surgery     Family History  Problem Relation Age of Onset  . Kidney failure Father   . Cancer Maternal Grandfather   . Cancer Paternal Grandfather   . Depression Mother   . Suicidality Neg Hx   . Seizures Neg Hx   . Schizophrenia Neg Hx   . Anxiety disorder Neg Hx    History  Substance Use Topics  . Smoking status: Never Smoker   . Smokeless tobacco: Never Used  . Alcohol Use: Yes     Comment: occ- 2x/month 1-2 drinks per episode   OB History    No  data available     Review of Systems  Musculoskeletal: Positive for myalgias and arthralgias. Negative for back pain and neck pain.  Skin: Negative for rash and wound.  Neurological: Negative for dizziness, syncope, weakness and numbness.      Allergies  Review of patient's allergies indicates no known allergies.  Home Medications   Prior to Admission medications   Medication Sig Start Date End Date Taking? Authorizing Provider  albuterol (PROVENTIL HFA;VENTOLIN HFA) 108 (90 BASE) MCG/ACT inhaler Inhale 2 puffs into the lungs as needed for wheezing.    Historical Provider, MD  busPIRone (BUSPAR) 5 MG tablet Take 1 tablet (5 mg total) by mouth 2 (two) times daily. 10/07/14   Oletta DarterSalina Agarwal, MD  hydrOXYzine (VISTARIL) 25 MG capsule Take 25-50mg  po qHS prn insomnia 10/07/14   Oletta DarterSalina Agarwal, MD  naproxen (NAPROSYN) 500 MG tablet Take 1 tablet (500 mg total) by mouth 2 (two) times daily. 12/14/14   Rawson Minix Patel-Mills, PA-C  oxyCODONE-acetaminophen (PERCOCET/ROXICET) 5-325 MG per tablet Take 2 tablets by mouth every 4 (four) hours as needed for severe pain. 12/14/14   Mansa Willers Patel-Mills, PA-C  pregabalin (LYRICA) 150 MG capsule Take 150 mg by mouth 2 (two) times daily.    Historical Provider, MD  sertraline (ZOLOFT) 100 MG tablet Take 1.5 tablets (150 mg total) by  mouth daily. 10/07/14   Oletta Darter, MD   BP 132/109 mmHg  Pulse 93  Temp(Src) 99.1 F (37.3 C) (Oral)  Resp 18  Ht  (1.575 m)  Wt 200 lb (90.719 kg)  BMI 36.57 kg/m2  SpO2 98%  LMP 12/10/2014 Physical Exam  Constitutional: She is oriented to person, place, and time. She appears well-developed and well-nourished.  HENT:  Head: Normocephalic and atraumatic.  Eyes: Conjunctivae are normal.  Cardiovascular: Normal rate and regular rhythm.   Pulmonary/Chest: Effort normal.  Musculoskeletal:  Right ankle: She is able to flex and extend the toes. She has a good DP pulse. She has tenderness to palpation of the talus,  navicular, and the fourth and fifth metatarsals. She has moderate edema along the fourth and fifth metatarsals.   No right knee tenderness.  Neurological: She is alert and oriented to person, place, and time.  Skin: Skin is warm and dry.  Nursing note and vitals reviewed.   ED Course  Procedures (including critical care time) Labs Review Labs Reviewed - No data to display  Imaging Review Dg Ankle Complete Right  12/14/2014   CLINICAL DATA:  29 year old with lateral ankle pain after fall earlier today  EXAM: RIGHT ANKLE - COMPLETE 3+ VIEW  COMPARISON:  None.  FINDINGS: There is no evidence of fracture, dislocation, or joint effusion. There is no evidence of arthropathy or other focal bone abnormality. Soft tissues are unremarkable.  IMPRESSION: Negative.   Electronically Signed   By: Malachy Moan M.D.   On: 12/14/2014 21:28     EKG Interpretation None      MDM   Final diagnoses:  Ankle sprain, right, initial encounter  Patient presents after a right ankle injury. Her right ankle x-ray is negative for acute fracture, dislocation, joint effusion. I have given her ice and Percocet in the ED. I discussed rest, ice, elevation. I have given her crutches and a right ankle Aircast.  I gave her ortho follow up in 1 week for continued pain.  I also gave her naproxen for pain and percocet for breakthrough pain.  Patient verbally agrees with the plan.  Sylvia Gosselin, PA-C 12/14/14 1610  Gwyneth Sprout, MD 12/16/14 [phone]

## 2014-12-14 NOTE — ED Notes (Signed)
She stepped in a hole in the grass and fell. Injury to her right ankle.

## 2014-12-14 NOTE — Discharge Instructions (Signed)
Ankle Sprain Rest.ice.elevation. Tried to get off of crutches within 1 week to prevent arm injury. Take Percocet for breakthrough pain. An ankle sprain is an injury to the strong, fibrous tissues (ligaments) that hold the bones of your ankle joint together.  CAUSES An ankle sprain is usually caused by a fall or by twisting your ankle. Ankle sprains most commonly occur when you step on the outer edge of your foot, and your ankle turns inward. People who participate in sports are more prone to these types of injuries.  SYMPTOMS   Pain in your ankle. The pain may be present at rest or only when you are trying to stand or walk.  Swelling.  Bruising. Bruising may develop immediately or within 1 to 2 days after your injury.  Difficulty standing or walking, particularly when turning corners or changing directions. DIAGNOSIS  Your caregiver will ask you details about your injury and perform a physical exam of your ankle to determine if you have an ankle sprain. During the physical exam, your caregiver will press on and apply pressure to specific areas of your foot and ankle. Your caregiver will try to move your ankle in certain ways. An X-ray exam may be done to be sure a bone was not broken or a ligament did not separate from one of the bones in your ankle (avulsion fracture).  TREATMENT  Certain types of braces can help stabilize your ankle. Your caregiver can make a recommendation for this. Your caregiver may recommend the use of medicine for pain. If your sprain is severe, your caregiver may refer you to a surgeon who helps to restore function to parts of your skeletal system (orthopedist) or a physical therapist. HOME CARE INSTRUCTIONS   Apply ice to your injury for 1-2 days or as directed by your caregiver. Applying ice helps to reduce inflammation and pain.  Put ice in a plastic bag.  Place a towel between your skin and the bag.  Leave the ice on for 15-20 minutes at a time, every 2 hours  while you are awake.  Only take over-the-counter or prescription medicines for pain, discomfort, or fever as directed by your caregiver.  Elevate your injured ankle above the level of your heart as much as possible for 2-3 days.  If your caregiver recommends crutches, use them as instructed. Gradually put weight on the affected ankle. Continue to use crutches or a cane until you can walk without feeling pain in your ankle.  If you have a plaster splint, wear the splint as directed by your caregiver. Do not rest it on anything harder than a pillow for the first 24 hours. Do not put weight on it. Do not get it wet. You may take it off to take a shower or bath.  You may have been given an elastic bandage to wear around your ankle to provide support. If the elastic bandage is too tight (you have numbness or tingling in your foot or your foot becomes cold and blue), adjust the bandage to make it comfortable.  If you have an air splint, you may blow more air into it or let air out to make it more comfortable. You may take your splint off at night and before taking a shower or bath. Wiggle your toes in the splint several times per day to decrease swelling. SEEK MEDICAL CARE IF:   You have rapidly increasing bruising or swelling.  Your toes feel extremely cold or you lose feeling in your foot.  Your pain is not relieved with medicine. SEEK IMMEDIATE MEDICAL CARE IF:  Your toes are numb or blue.  You have severe pain that is increasing. MAKE SURE YOU:   Understand these instructions.  Will watch your condition.  Will get help right away if you are not doing well or get worse. Document Released: 06/25/2005 Document Revised: 03/19/2012 Document Reviewed: 07/07/2011 [facility] Patient Information 2015 [place], [place]. This information is not intended to replace advice given to you by your health care provider. Make sure you discuss any questions you have with your health care provider.

## 2015-01-06 ENCOUNTER — Ambulatory Visit (HOSPITAL_COMMUNITY): Payer: Self-pay | Admitting: Psychiatry

## 2015-01-13 ENCOUNTER — Ambulatory Visit (INDEPENDENT_AMBULATORY_CARE_PROVIDER_SITE_OTHER): Payer: BLUE CROSS/BLUE SHIELD | Admitting: Psychiatry

## 2015-01-13 ENCOUNTER — Encounter (HOSPITAL_COMMUNITY): Payer: Self-pay | Admitting: Psychiatry

## 2015-01-13 VITALS — BP 118/74 | HR 77 | Ht 63.0 in | Wt 205.6 lb

## 2015-01-13 DIAGNOSIS — F411 Generalized anxiety disorder: Secondary | ICD-10-CM

## 2015-01-13 DIAGNOSIS — F5105 Insomnia due to other mental disorder: Secondary | ICD-10-CM

## 2015-01-13 DIAGNOSIS — F331 Major depressive disorder, recurrent, moderate: Secondary | ICD-10-CM | POA: Diagnosis not present

## 2015-01-13 DIAGNOSIS — F418 Other specified anxiety disorders: Secondary | ICD-10-CM

## 2015-01-13 MED ORDER — BUPROPION HCL ER (SR) 100 MG PO TB12: 100.0000 mg | ORAL_TABLET | Freq: Two times a day (BID) | ORAL | Status: DC

## 2015-01-13 MED ORDER — SERTRALINE HCL 100 MG PO TABS: 150.0000 mg | ORAL_TABLET | Freq: Every day | ORAL | Status: DC

## 2015-01-13 MED ORDER — HYDROXYZINE PAMOATE 50 MG PO CAPS: ORAL_CAPSULE | ORAL | Status: DC

## 2015-01-13 MED ORDER — BUSPIRONE HCL 5 MG PO TABS: 5.0000 mg | ORAL_TABLET | Freq: Two times a day (BID) | ORAL | Status: DC

## 2015-01-13 NOTE — Progress Notes (Signed)
[facility]  Progress Note  Sylvia Parker [id number] 29 y.o.  01/13/2015 8:07 AM  Chief Complaint: I have been feeling more depressed  History of Present Illness: Patient seen for the first time by Dr. Karie Schwalbe ADD [facility]. Patient sees Dr. Michae KavaAgarwal under not regular basis who is currently only. Patient reports that she works from home and lately her mood has been more depressed with increased anxiety and poor concentration which is affecting her work. Also has insomnia and states that especially she middle and terminal insomnia which is causing her to be tired. Denies panic attacks. Has occasional crying spells, appetite is poor has anhedonia..  Tolerating her medications well and reports no side effects. Discussed increasing her Vistaril and starting her on  Wellbutrin for her depression and concentration and patient gave informed consent after I discussed the rationale risks benefits options Patient states she had her labs done at her PCP and was encouraged to get a report of the labs for as she stated understanding and is willing to do so.    Suicidal Ideation: No Plan Formed: No Patient has means to carry out plan: No  Homicidal Ideation: No Plan Formed: No Patient has means to carry out plan: No  Review of Systems: Psychiatric: Agitation: Yes Hallucination: No Depressed Mood: Yes Insomnia: No Hypersomnia: No Altered Concentration: Yes Feels Worthless: Yes Grandiose Ideas: No Belief In Special Powers: No New/Increased Substance Abuse: No now smoking THC several times a week Compulsions: No  Neurologic: Headache: Yes Seizure: No Paresthesias: No   Review of Systems  Constitutional: Positive for weight loss. Negative for fever, chills and malaise/fatigue.  HENT: Negative for congestion, ear pain, hearing loss, nosebleeds and sore throat.   Eyes: Negative for blurred vision, double vision, photophobia, pain and redness.  Respiratory: Negative for cough,  shortness of breath and wheezing.   Cardiovascular: Negative for chest pain, palpitations and leg swelling.  Gastrointestinal: Positive for constipation. Negative for heartburn, nausea, vomiting and abdominal pain.  Genitourinary: Negative for dysuria, urgency, frequency and hematuria.  Musculoskeletal: Negative for myalgias, back pain, joint pain, falls and neck pain.  Skin: Negative for itching and rash.  Neurological: Positive for headaches. Negative for dizziness, tremors, sensory change, seizures and loss of consciousness.  Endo/Heme/Allergies: Negative for environmental allergies and polydipsia. Does not bruise/bleed easily.  Psychiatric/Behavioral: Positive for depression, memory loss and substance abuse. Negative for suicidal ideas and hallucinations. The patient is nervous/anxious.      Past Medical Family, Social History: living with wife. No kids. Working at [employer] for last 4 yrs. raised by mom and dad separately. Parents divorced when she was 7. Has 2 brother and 2 sisters. Pt is in the middle. States it was hard b/c her dad wasn't there and mom was depressed.  reports that she has never smoked. She has never used smokeless tobacco. She reports that she drinks alcohol. She reports that she uses illicit drugs (Marijuana).  Family History  Problem Relation Age of Onset  . Kidney failure Father   . Cancer Maternal Grandfather   . Cancer Paternal Grandfather   . Depression Mother   . Suicidality Neg Hx   . Seizures Neg Hx   . Schizophrenia Neg Hx   . Anxiety disorder Neg Hx     Past Medical History  Diagnosis Date  . Allergy   . Asthma   . Depression    Outpatient Encounter Prescriptions as of 01/13/2015  Medication Sig  . albuterol (PROVENTIL  HFA;VENTOLIN HFA) 108 (90 BASE) MCG/ACT inhaler Inhale 2 puffs into the lungs as needed for wheezing.  . busPIRone (BUSPAR) 5 MG tablet Take 1 tablet (5 mg total) by mouth 2 (two) times daily.  . hydrOXYzine (VISTARIL) 25  MG capsule Take 25-50mg  po qHS prn insomnia  . naproxen (NAPROSYN) 500 MG tablet Take 1 tablet (500 mg total) by mouth 2 (two) times daily.  Marland Kitchen oxyCODONE-acetaminophen (PERCOCET/ROXICET) 5-325 MG per tablet Take 2 tablets by mouth every 4 (four) hours as needed for severe pain.  . pregabalin (LYRICA) 150 MG capsule Take 150 mg by mouth 2 (two) times daily.  . sertraline (ZOLOFT) 100 MG tablet Take 1.5 tablets (150 mg total) by mouth daily.   No facility-administered encounter medications on file as of 01/13/2015.    Past Psychiatric History/Hospitalization(s): Anxiety: No Bipolar Disorder: No Depression: No Mania: No Psychosis: No Schizophrenia: No Personality Disorder: No Hospitalization for psychiatric illness: No History of Electroconvulsive Shock Therapy: No Prior Suicide Attempts: Yes  Physical Exam: Constitutional:  LMP 12/10/2014  General Appearance: alert, oriented, no acute distress  Musculoskeletal: Strength & Muscle Tone: within normal limits Gait & Station: normal Patient leans: N/A  Mental Status Examination/Evaluation: Objective: Attitude: Calm and cooperative  Appearance: Fairly Groomed and Neat, appears to be stated age  Eye Contact::  Good  Speech:  Clear and Coherent and Normal Rate  Volume:  Normal  Mood: anxious  Affect:  Full Range  Thought Process:  Goal Directed, Linear and Logical  Orientation:  Full (Time, Place, and Person)  Thought Content:  Negative  Suicidal Thoughts:  No  Homicidal Thoughts:  No  Judgement:  Good  Insight:  Good  Concentration: Fair   Memory: Immediate-good Recent-good Remote-good  Recall: fair  Language: fair  Gait and Station: normal  [employer] of Knowledge: average  Psychomotor Activity:  Normal  Akathisia:  No  Handed:  Right  AIMS (if indicated): n/a  Assets:  Communication Skills Desire for Improvement Housing Intimacy Social Support Horticulturist, commercial (Choose Three): Review of Psycho-Social Stressors (1), Review or order clinical lab tests (1), Established Problem, Worsening (2), Review of Medication Regimen & Side Effects (2) and Review of New Medication or Change in Dosage (2)  Assessment: AXIS I Generalized Anxiety Disorder and Major Depression, Recurrent moderate  AXIS II Deferred  AXIS III Past Medical History  Diagnosis Date  . Allergy   . Asthma      AXIS IV other psychosocial or environmental problems and problems with primary support group  AXIS V 51-60 moderate symptoms   Treatment Plan/Recommendations:  Plan of Care: Medication management with supportive therapy. Risks/benefits and SE of the medication discussed. Pt verbalized understanding and verbal consent obtained for treatment. Affirm with the patient that the medications are taken as ordered. Patient expressed understanding of how their medications were to be used.    Laboratory: pt reminded to get labs before next visit  CBC Chemistry Profile HbAIC UDS Vitamin B-12 TSH  Psychotherapy: Therapy: brief supportive therapy provided. Discussed psychosocial stressors in detail.    Medications: Start Wellbutrin SR 100 mg a.m. and at p.m. for depression and concentration Continue Zoloft to 150mg  po qD for depression and anxiety Continue of Buspar 5mg  po BID  Vistaril 25-50mg  po qHS prn insomnia   Routine PRN Medications: Yes  Consultations: encouraged to continue individual therapy with Dr. Everardo Pacific every 2 weeks- going well  Encouraged pt to establish  care with PCP  Safety Concerns: Pt denies SI and is at an acute low risk for suicide.Patient told to call clinic if any problems occur. Patient advised to go to ER if they should develop SI/HI, side effects, or if symptoms worsen. Has crisis numbers to call if needed. Pt verbalized understanding.   Other: F/up in 3 months or sooner if needed     Margit Banda, MD 01/13/2015

## 2015-02-13 ENCOUNTER — Emergency Department (HOSPITAL_BASED_OUTPATIENT_CLINIC_OR_DEPARTMENT_OTHER)
Admission: EM | Admit: 2015-02-13 | Discharge: 2015-02-13 | Disposition: A | Discharge status: Home / Self Care | Payer: BLUE CROSS/BLUE SHIELD | Attending: Emergency Medicine | Admitting: Emergency Medicine

## 2015-02-13 DIAGNOSIS — X58XXXA Exposure to other specified factors, initial encounter: Secondary | ICD-10-CM | POA: Diagnosis not present

## 2015-02-13 DIAGNOSIS — Y9289 Other specified places as the place of occurrence of the external cause: Secondary | ICD-10-CM | POA: Insufficient documentation

## 2015-02-13 DIAGNOSIS — S39012A Strain of muscle, fascia and tendon of lower back, initial encounter: Secondary | ICD-10-CM

## 2015-02-13 DIAGNOSIS — J45909 Unspecified asthma, uncomplicated: Secondary | ICD-10-CM | POA: Insufficient documentation

## 2015-02-13 DIAGNOSIS — Y9389 Activity, other specified: Secondary | ICD-10-CM | POA: Insufficient documentation

## 2015-02-13 DIAGNOSIS — Y998 Other external cause status: Secondary | ICD-10-CM | POA: Diagnosis not present

## 2015-02-13 DIAGNOSIS — S3992XA Unspecified injury of lower back, initial encounter: Secondary | ICD-10-CM | POA: Diagnosis present

## 2015-02-13 DIAGNOSIS — Z79899 Other long term (current) drug therapy: Secondary | ICD-10-CM | POA: Diagnosis not present

## 2015-02-13 DIAGNOSIS — F329 Major depressive disorder, single episode, unspecified: Secondary | ICD-10-CM | POA: Insufficient documentation

## 2015-02-13 MED ORDER — HYDROCODONE-ACETAMINOPHEN 5-325 MG PO TABS
ORAL_TABLET | ORAL | Status: DC
Start: 2015-02-13 — End: 2016-03-31

## 2015-02-13 MED ORDER — PREDNISONE 50 MG PO TABS: ORAL_TABLET | ORAL | Status: DC

## 2015-02-13 MED ORDER — DIAZEPAM 5 MG PO TABS
5.0000 mg | ORAL_TABLET | Freq: Once | ORAL | Status: AC
  Administered 2015-02-13: 5 mg via ORAL
  Filled 2015-02-13: qty 1

## 2015-02-13 MED ORDER — PREDNISONE 50 MG PO TABS
60.0000 mg | ORAL_TABLET | Freq: Once | ORAL | Status: AC
  Administered 2015-02-13: 60 mg via ORAL
  Filled 2015-02-13 (×2): qty 1

## 2015-02-13 MED ORDER — METHOCARBAMOL 500 MG PO TABS: 1000.0000 mg | ORAL_TABLET | Freq: Four times a day (QID) | ORAL | Status: DC | PRN

## 2015-02-13 MED ORDER — MORPHINE SULFATE 4 MG/ML IJ SOLN
4.0000 mg | Freq: Once | INTRAMUSCULAR | Status: AC
  Administered 2015-02-13: 4 mg via INTRAMUSCULAR
  Filled 2015-02-13: qty 1

## 2015-02-13 NOTE — Discharge Instructions (Signed)
Please take ibuprofen 400mg (this is normally 2 over the counter pills) every 6 hours (take with food to minimze stomach irritation).  ° °Take robaxin and/or Vicodin for breakthrough pain, do not drink alcohol, drive, care for children or perfom other critical tasks while taking robaxin and/or Vicodin . ° °Please follow with your primary care doctor in the next 2 days for a check-up. They must obtain records for further management.  ° °Do not hesitate to return to the Emergency Department for any new, worsening or concerning symptoms.  ° °

## 2015-02-13 NOTE — ED Provider Notes (Signed)
CSN: [id number]     Arrival date & time 02/13/15  2154 History  This chart was scribed for non-physician practitioner, Wynetta Emery, PA-C, working with Benjiman Core, MD, by Budd Palmer ED Scribe. This patient was seen in room MH01/MH01 and the patient's care was started at 10:31 PM     Chief Complaint  Patient presents with  . Back Pain   The history is provided by the patient. No language interpreter was used.   HPI Comments: Sylvia Parker is a 29 y.o. female who presents to the Emergency Department complaining of constant, stabbing back pain shooting down to her legs onset earlier today. Pain is 10 out of 10, taking Aleve with no relief. She states she was coughing when she felt a sharp, stabbing pain in her back. She has since been unable to move without pain. She notes associated tingling in both legs.  She has NKDA.  Past Medical History  Diagnosis Date  . Allergy   . Asthma   . Depression    Past Surgical History  Procedure Laterality Date  . Breast surgery     Family History  Problem Relation Age of Onset  . Kidney failure Father   . Cancer Maternal Grandfather   . Cancer Paternal Grandfather   . Depression Mother   . Suicidality Neg Hx   . Seizures Neg Hx   . Schizophrenia Neg Hx   . Anxiety disorder Neg Hx    History  Substance Use Topics  . Smoking status: Never Smoker   . Smokeless tobacco: Never Used  . Alcohol Use: Yes     Comment: occ- 2x/month 1-2 drinks per episode   OB History    No data available     Review of Systems A complete 10 system review of systems was obtained and all systems are negative except as noted in the HPI and PMH.    Allergies  Review of patient's allergies indicates no known allergies.  Home Medications   Prior to Admission medications   Medication Sig Start Date End Date Taking? Authorizing Provider  buPROPion (WELLBUTRIN SR) 100 MG 12 hr tablet Take 1 tablet (100 mg total) by mouth 2 (two) times daily.  01/13/15 01/13/16 Yes Gayland Curry, MD  busPIRone (BUSPAR) 5 MG tablet Take 1 tablet (5 mg total) by mouth 2 (two) times daily. 01/13/15  Yes Gayland Curry, MD  pregabalin (LYRICA) 150 MG capsule Take 150 mg by mouth 2 (two) times daily.   Yes Historical Provider, MD  albuterol (PROVENTIL HFA;VENTOLIN HFA) 108 (90 BASE) MCG/ACT inhaler Inhale 2 puffs into the lungs as needed for wheezing.    Historical Provider, MD  hydrOXYzine (VISTARIL) 50 MG capsule Take 25-50mg  po qHS prn insomnia 01/13/15   Gayland Curry, MD  naproxen (NAPROSYN) 500 MG tablet Take 1 tablet (500 mg total) by mouth 2 (two) times daily. 12/14/14   Hanna Patel-Mills, PA-C  oxyCODONE-acetaminophen (PERCOCET/ROXICET) 5-325 MG per tablet Take 2 tablets by mouth every 4 (four) hours as needed for severe pain. 12/14/14   Hanna Patel-Mills, PA-C  sertraline (ZOLOFT) 100 MG tablet Take 1.5 tablets (150 mg total) by mouth daily. 01/13/15   Gayland Curry, MD   BP 121/72 mmHg  Pulse 109  Temp(Src) 98.5 F (36.9 C) (Oral)  Resp 18  Ht 5\' 2"  (1.575 m)  Wt 205 lb 3 oz (93.072 kg)  BMI 37.52 kg/m2  SpO2 95%  LMP 01/31/2015 (Approximate) Physical Exam  Constitutional: She is oriented  to person, place, and time. She appears well-developed and well-nourished. No distress.  Tearful, appears acutely uncomfortable.  HENT:  Head: Normocephalic.  Eyes: Conjunctivae and EOM are normal.  Neck: Normal range of motion.  Cardiovascular: Normal rate, regular rhythm and intact distal pulses.   Pulmonary/Chest: Effort normal. No stridor.  Abdominal: Soft. There is no tenderness.  Musculoskeletal: Normal range of motion.  Neurological: She is alert and oriented to person, place, and time.  No point tenderness to percussion of lumbar spinal processes.  No TTP or paraspinal muscular spasm. Strength is 5 out of 5 to bilateral lower extremities at hip and knee; extensor hallucis longus 5 out of 5. Ankle strength 5 out of 5, no clonus,  neurovascularly intact. No saddle anaesthesia. Patellar reflexes are 2+ bilaterally.    Straight leg raise is positive on the left side at 40, positive on the right at around 35.   Psychiatric: She has a normal mood and affect.  Nursing note and vitals reviewed.   ED Course  Procedures  DIAGNOSTIC STUDIES: Oxygen Saturation is 95% on RA, adequate by my interpretation.    COORDINATION OF CARE: 10:35 PM - Discussed probable pinched nerve. Discussed plans to order pain medication, anti-inflammatory medication, muscle relaxant, and a steroid. Pt advised of plan for treatment and pt agrees.  Labs Review Labs Reviewed - No data to display  Imaging Review No results found.   EKG Interpretation None      MDM   Final diagnoses:  None    Filed Vitals:   02/13/15 2204  BP: 121/72  Pulse: 109  Temp: 98.5 F (36.9 C)  TempSrc: Oral  Resp: 18  Height:  (1.575 m)  Weight: 205 lb 3 oz (93.072 kg)  SpO2: 95%    Medications  morphine 4 MG/ML injection 4 mg (4 mg Intramuscular Given 02/13/15 2246)  diazepam (VALIUM) tablet 5 mg (5 mg Oral Given 02/13/15 2245)  predniSONE (DELTASONE) tablet 60 mg (60 mg Oral Given 02/13/15 2245)    Sylvia Parker is a pleasant 28 y.o. female presenting with severe low back pain radiating down posterior both eyes.  No neurological deficits and normal neuro exam.  Patient can walk but states is painful.  No loss of bowel or bladder control.  No concern for cauda equina.  No fever, night sweats, weight loss, h/o cancer, IVDU.  RICE protocol and pain medicine indicated and discussed with patient.  Evaluation does not show pathology that would require ongoing emergent intervention or inpatient treatment. Pt is hemodynamically stable and mentating appropriately. Discussed findings and plan with patient/guardian, who agrees with care plan. All questions answered. Return precautions discussed and outpatient follow up given.   New Prescriptions    HYDROCODONE-ACETAMINOPHEN (NORCO/VICODIN) 5-325 MG PER TABLET    Take 1-2 tablets by mouth every 6 hours as needed for pain and/or cough.   METHOCARBAMOL (ROBAXIN) 500 MG TABLET    Take 2 tablets (1,000 mg total) by mouth 4 (four) times daily as needed (Pain).   PREDNISONE (DELTASONE) 50 MG TABLET    Take 1 tablet daily with breakfast    I personally performed the services described in this documentation, which was scribed in my presence. The recorded information has been reviewed and is accurate.    Wynetta Emery, PA-C 02/13/15 9147  Benjiman Core, MD 02/14/15 [phone]

## 2015-02-13 NOTE — ED Notes (Signed)
Presents with back pain in lower back and radiates down both legs-started after coughing hard due to choking. Pt has difficulty standing up straight.

## 2015-04-19 ENCOUNTER — Encounter (HOSPITAL_COMMUNITY): Payer: Self-pay | Admitting: Psychiatry

## 2015-04-19 ENCOUNTER — Ambulatory Visit (INDEPENDENT_AMBULATORY_CARE_PROVIDER_SITE_OTHER): Payer: BLUE CROSS/BLUE SHIELD | Admitting: Psychiatry

## 2015-04-19 VITALS — BP 133/86 | HR 88 | Ht 63.0 in | Wt 208.0 lb

## 2015-04-19 DIAGNOSIS — F341 Dysthymic disorder: Secondary | ICD-10-CM | POA: Diagnosis not present

## 2015-04-19 DIAGNOSIS — F411 Generalized anxiety disorder: Secondary | ICD-10-CM

## 2015-04-19 DIAGNOSIS — F331 Major depressive disorder, recurrent, moderate: Secondary | ICD-10-CM | POA: Diagnosis not present

## 2015-04-19 DIAGNOSIS — F418 Other specified anxiety disorders: Secondary | ICD-10-CM

## 2015-04-19 DIAGNOSIS — F5105 Insomnia due to other mental disorder: Secondary | ICD-10-CM

## 2015-04-19 MED ORDER — HYDROXYZINE PAMOATE 50 MG PO CAPS: ORAL_CAPSULE | ORAL | Status: DC

## 2015-04-19 MED ORDER — BUPROPION HCL ER (SR) 100 MG PO TB12: 100.0000 mg | ORAL_TABLET | Freq: Two times a day (BID) | ORAL | Status: DC

## 2015-04-19 MED ORDER — BUSPIRONE HCL 10 MG PO TABS: 10.0000 mg | ORAL_TABLET | Freq: Two times a day (BID) | ORAL | Status: DC

## 2015-04-19 MED ORDER — SERTRALINE HCL 100 MG PO TABS: 200.0000 mg | ORAL_TABLET | Freq: Every day | ORAL | Status: DC

## 2015-04-19 NOTE — Progress Notes (Signed)
[facility] [id number] Progress Note  K. Bailee Metter [id number] 29 y.o.  04/19/2015 11:58 AM  Chief Complaint: anxiety is getting worse  History of Present Illness: Pt reports Parker tolerance is low. Irritability and anxiety are increased. Pt is having nervous Parker, Sylvia Parker, Sylvia Parker, Sylvia Parker, Sylvia Parker induced panic attacks near daily. States it is mostly related to her work and would quit if she could. She is overwhelmed. Pt thinks it would be better if she could go in to the office but they are overstaffed and wont' allow her to.   Today states depression is getting worse. Her therapist has moved and things are building up. Pt is looking for a new therapist. Wellbutrin is helping with confusion and concentration. Reports crying spells are happening daily. Reports pt is more emotional and very sensitive. . Reports on going sad and anhedonia. Reports isolation, feelings of worthlessness and hopelessness.   Sleep is fair she is getting about 6-7 hrs. Energy is good. Appetite is decreased even more and she is eating once/day. States that is her usual response to Parker.   Taking meds as prescribed and denies SE.   Suicidal Ideation: No Plan Formed: No Patient has means to carry out plan: No  Homicidal Ideation: No Plan Formed: No Patient has means to carry out plan: No  Review of Systems: Psychiatric: Agitation: Yes Hallucination: No Depressed Mood: Yes Insomnia: No Hypersomnia: No Altered Concentration: Yes Feels Worthless: Yes Grandiose Ideas: No Belief In Special Powers: No New/Increased Substance Abuse: Yes smoking THC multiple times a day Compulsions: No  Neurologic: Headache: No Seizure: No Paresthesias: No   Review of Systems  Constitutional: Negative for fever, chills and malaise/fatigue.  HENT: Positive for ear pain. Negative for congestion,  hearing loss, nosebleeds and sore throat.   Eyes: Negative for blurred vision, double vision, photophobia and redness.  Respiratory: Negative for cough, shortness of breath and wheezing.   Cardiovascular: Negative for chest pain, palpitations and leg swelling.  Gastrointestinal: Positive for heartburn and abdominal pain. Negative for nausea and vomiting.  Musculoskeletal: Positive for back pain. Negative for joint pain and neck pain.  Skin: Negative for itching and rash.  Neurological: Negative for dizziness, tremors, sensory change, seizures, loss of consciousness and headaches.  Psychiatric/Behavioral: Positive for depression. Negative for suicidal ideas, hallucinations and substance abuse. The patient is nervous/anxious. The patient does not have insomnia.      Past Medical Family, Social History: living with wife. No kids. Working at [employer] for last 4 yrs. raised by mom and dad separately. Parents divorced when she was 7. Has 2 brother and 2 sisters. Pt is in the middle. States it was hard b/c her dad wasn't there and mom was depressed.  reports that she has never smoked. She has never used smokeless tobacco. She reports that she drinks alcohol. She reports that she uses illicit drugs (Marijuana).  Family History  Problem Relation Age of Onset  . Kidney failure Father   . Cancer Maternal Grandfather   . Cancer Paternal Grandfather   . Depression Mother   . Suicidality Neg Hx   . Seizures Neg Hx   . Schizophrenia Neg Hx   . Anxiety disorder Neg Hx     Past Medical History  Diagnosis Date  . Allergy   . Asthma   . Depression    Outpatient Encounter Prescriptions as of 04/19/2015  Medication Sig  .  albuterol (PROVENTIL HFA;VENTOLIN HFA) 108 (90 BASE) MCG/ACT inhaler Inhale 2 puffs into the lungs as needed for wheezing.  Marland Kitchen buPROPion (WELLBUTRIN SR) 100 MG 12 hr tablet Take 1 tablet (100 mg total) by mouth 2 (two) times daily.  . busPIRone (BUSPAR) 5 MG tablet Take 1  tablet (5 mg total) by mouth 2 (two) times daily.  Marland Kitchen HYDROcodone-acetaminophen (NORCO/VICODIN) 5-325 MG per tablet Take 1-2 tablets by mouth every 6 hours as needed for pain and/or cough.  . hydrOXYzine (VISTARIL) 50 MG capsule Take 25-50mg  po qHS prn insomnia  . methocarbamol (ROBAXIN) 500 MG tablet Take 2 tablets (1,000 mg total) by mouth 4 (four) times daily as needed (Pain).  . naproxen (NAPROSYN) 500 MG tablet Take 1 tablet (500 mg total) by mouth 2 (two) times daily.  Marland Kitchen oxyCODONE-acetaminophen (PERCOCET/ROXICET) 5-325 MG per tablet Take 2 tablets by mouth every 4 (four) hours as needed for severe pain.  . predniSONE (DELTASONE) 50 MG tablet Take 1 tablet daily with breakfast  . pregabalin (LYRICA) 150 MG capsule Take 150 mg by mouth 2 (two) times daily.  . sertraline (ZOLOFT) 100 MG tablet Take 1.5 tablets (150 mg total) by mouth daily.   No facility-administered encounter medications on file as of 04/19/2015.    Past Psychiatric History/Hospitalization(s): Anxiety: No Bipolar Disorder: No Depression: No Mania: No Psychosis: No Schizophrenia: No Personality Disorder: No Hospitalization for psychiatric illness: No History of Electroconvulsive Shock Therapy: No Prior Suicide Attempts: Yes  Physical Exam: Constitutional:  BP 133/86 mmHg  Pulse 88  Ht  (1.6 m)  Wt 208 lb (94.348 kg)  BMI 36.85 kg/m2  General Appearance: alert, oriented, no acute distress  Musculoskeletal: Strength & Muscle Tone: within normal limits Gait & Station: normal Patient leans: N/A  Mental Status Examination/Evaluation: Objective: Attitude: Calm and cooperative  Appearance: Fairly Groomed and Neat, appears to be stated age  Eye Contact::  Good  Speech:  Clear and Coherent and Normal Rate  Volume:  Normal  Mood: anxious and depressed  Affect:  Congruent  Thought Process:  Goal Directed, Linear and Logical  Orientation:  Full (Time, Place, and Person)  Thought Content:  Negative   Suicidal Thoughts:  No  Homicidal Thoughts:  No  Judgement:  Good  Insight:  Good  Concentration: good  Memory: Immediate-good Recent-good Remote-good  Recall: fair  Language: fair  Gait and Station: normal  [employer] of Knowledge: average  Psychomotor Activity:  Normal  Akathisia:  No  Handed:  Right  AIMS (if indicated): n/a  Assets:  Communication Skills Desire for Improvement Housing Intimacy Social Support Location manager (Choose Three): Review of Psycho-Social Stressors (1), Review or order clinical lab tests (1), Established Problem, Worsening (2), Review of Medication Regimen & Side Effects (2) and Review of New Medication or Change in Dosage (2)  Assessment: AXIS I Generalized Anxiety Disorder and Major Depression, Recurrent moderate  AXIS II Deferred  AXIS III Past Medical History  Diagnosis Date  . Allergy   . Asthma      AXIS IV other psychosocial or environmental problems and problems with primary support group  AXIS V 51-60 moderate symptoms   Treatment Plan/Recommendations:  Plan of Care: Medication management with supportive therapy. Risks/benefits and SE of the medication discussed. Pt verbalized understanding and verbal consent obtained for treatment. Affirm with the patient that the medications are taken as ordered. Patient expressed understanding of how their medications were to be  used.    Laboratory: pt reminded to get labs before next visit  CBC Chemistry Profile HbAIC UDS Vitamin B-12 TSH  Psychotherapy: Therapy: brief supportive therapy provided. Discussed psychosocial stressors in detail.    Medications: Increase Zoloft to 200mg  po qD for depression and anxiety Increase Buspar to 10mg  po BID for anxiety Vistaril 25-50mg  po qHS prn insomnia Welbutrin SR 100mg  po BID for depression and concentration  Routine PRN Medications: Yes   Consultations: encouraged to continue individual therapy with Dr. Everardo Pacific every 2 weeks- going well  Encouraged pt to establish care with PCP  Safety Concerns: Pt denies SI and is at an acute low risk for suicide.Patient told to call clinic if any problems occur. Patient advised to go to ER if they should develop SI/HI, side effects, or if symptoms worsen. Has crisis numbers to call if needed. Pt verbalized understanding.   Other: F/up in 2 months or sooner if needed. Will approve 2 weeks of FMLA for depression and anxiety    Oletta Darter, MD 04/19/2015

## 2015-05-05 ENCOUNTER — Telehealth (HOSPITAL_COMMUNITY): Payer: Self-pay

## 2015-05-05 NOTE — Telephone Encounter (Signed)
Met with Dr. Doyne Keel to finish patient's FMLA forms this date and called patient to inform they were ready to be faxed but that we needed patient to come by and sign a consent to send to her employer.  Patient reported she is currently out of the country in [place] until 05/14/15 so she states plan to call her employer and to have return date expanded until 05/16/15 and will come by that date to sign the forms so they can be faxed then.  Agreed to keep forms for patient, as they are ready to be sent to send once she returns.

## 2015-05-16 ENCOUNTER — Telehealth (HOSPITAL_COMMUNITY): Payer: Self-pay

## 2015-05-17 NOTE — Telephone Encounter (Signed)
Pt states she is doing ok. She is on FMLA until Nov 11 but she is having a lot of anxiety and is asking for another 1-2 weeks because she is not ready to go back. Thinking about work causes panic attacks and her mind keeps going back to work so she can't relax. She is has a lot of stress and doesn't think she can manage work on top of everything else. If symptoms don't improve in one week then she will "face it and do what I need to do".  Pt has been off for 3 weeks- 1 week of vacation and 2 weeks FMLA..  A/P: GAD and MD[employer] Declined pt's request to extend FMLA. Pt reports work is a major source of anxiety for her extending FMLA is unlikely to resolve her anxiety issues associated with work. Pt verbalized understanding.

## 2015-05-26 ENCOUNTER — Telehealth (HOSPITAL_COMMUNITY): Payer: Self-pay | Admitting: Psychiatry

## 2015-05-26 NOTE — Telephone Encounter (Signed)
D:  Return to work certification form is completed; with a return to work of 05-20-15 (without any restrictions).  A:  Placed call to pt to inform her that form is completed.  Inform Dr. Michae KavaAgarwal.

## 2015-06-21 ENCOUNTER — Encounter (HOSPITAL_COMMUNITY): Payer: Self-pay | Admitting: Psychiatry

## 2015-06-21 ENCOUNTER — Ambulatory Visit (INDEPENDENT_AMBULATORY_CARE_PROVIDER_SITE_OTHER): Payer: BLUE CROSS/BLUE SHIELD | Admitting: Psychiatry

## 2015-06-21 VITALS — BP 132/80 | HR 94 | Ht 62.5 in | Wt 203.6 lb

## 2015-06-21 DIAGNOSIS — F411 Generalized anxiety disorder: Secondary | ICD-10-CM | POA: Diagnosis not present

## 2015-06-21 DIAGNOSIS — F331 Major depressive disorder, recurrent, moderate: Secondary | ICD-10-CM

## 2015-06-21 DIAGNOSIS — G4700 Insomnia, unspecified: Secondary | ICD-10-CM | POA: Diagnosis not present

## 2015-06-21 MED ORDER — BUSPIRONE HCL 15 MG PO TABS: 15.0000 mg | ORAL_TABLET | Freq: Two times a day (BID) | ORAL | Status: DC

## 2015-06-21 MED ORDER — TRAZODONE HCL 50 MG PO TABS: ORAL_TABLET | ORAL | Status: DC

## 2015-06-21 MED ORDER — BUPROPION HCL ER (SR) 150 MG PO TB12: 150.0000 mg | ORAL_TABLET | Freq: Two times a day (BID) | ORAL | Status: DC

## 2015-06-21 MED ORDER — SERTRALINE HCL 100 MG PO TABS: 200.0000 mg | ORAL_TABLET | Freq: Every day | ORAL | Status: DC

## 2015-06-21 NOTE — Progress Notes (Signed)
Patient ID: Sylvia Parker, female   DOB: 1985-12-03, 29 y.o.   MRN: [id number]  [facility] [id number] Progress Note  Sylvia Parker [id number] 29 y.o.  06/21/2015 8:39 AM  Chief Complaint: its going...  History of Present Illness: Pt broke up with girlfriend in October. Pt reports she is stuck in [place]. She doesn't  know anyone here and then she [place] up in the summer. The stress of having to do everything herself is taking a toll. Pt is having a lot of GI upset that is getting worse. Pt has been evaluated by her PCP who states it is anxiety. Pt is worrying about everything. Pt is struggling financially.   Pt feels numb. Pt reports hopelessness and passive thoughts of death a few days ago. Today denies SI/HI/AVH. Pt feels she is at the beginning, prior to starting meds, in her mental illness. Reports pt is more emotional and very sensitive and is having frequent crying spells. Reports on going sad and anhedonia. Reports isolation, feelings of worthlessness and hopelessness.   Sleep is poor and she is getting about 5 hrs of broken sleep even with Vistaril. Energy is low but she is not napping.  Appetite is decreased even more and she is eating maybe once/day due to GI upset. Concentration is poor.    Taking meds as prescribed and endorsing SE of fatigue.  Pt is still trying to find a new therapist.   Suicidal Ideation: No Plan Formed: No Patient has means to carry out plan: No  Homicidal Ideation: No Plan Formed: No Patient has means to carry out plan: No  Review of Systems: Psychiatric: Agitation: Yes Hallucination: No Depressed Mood: Yes Insomnia: Yes Hypersomnia: No Altered Concentration: Yes Feels Worthless: Yes Grandiose Ideas: No Belief In Special Powers: No New/Increased Substance Abuse: Yes smoking THC multiple times a day Compulsions: No  Neurologic: Headache: Yes Seizure: No Paresthesias: No    Review of Systems  Constitutional: Positive for chills and malaise/fatigue. Negative for fever.  HENT: Negative for congestion, ear pain, hearing loss, nosebleeds and sore throat.   Eyes: Negative for blurred vision, double vision, photophobia and redness.  Respiratory: Negative for cough, shortness of breath and wheezing.   Cardiovascular: Negative for chest pain, palpitations and leg swelling.  Gastrointestinal: Positive for heartburn, nausea, vomiting and abdominal pain.  Musculoskeletal: Negative for back pain, joint pain and neck pain.  Skin: Negative for itching and rash.  Neurological: Positive for dizziness and headaches. Negative for tremors, sensory change, seizures and loss of consciousness.  Psychiatric/Behavioral: Positive for depression and substance abuse. Negative for suicidal ideas and hallucinations. The patient is nervous/anxious and has insomnia.      Past Medical Family, Social History: living with wife. No kids. Working at [employer] for last 4 yrs. raised by mom and dad separately. Parents divorced when she was 7. Has 2 brother and 2 sisters. Pt is in the middle. States it was hard b/c her dad wasn't there and mom was depressed.  reports that she has never smoked. She has never used smokeless tobacco. She reports that she drinks alcohol. She reports that she uses illicit drugs (Marijuana).  Family History  Problem Relation Age of Onset  . Kidney failure Father   . Cancer Maternal Grandfather   . Cancer Paternal Grandfather   . Depression Mother   . Suicidality Neg Hx   . Seizures Neg Hx   . Schizophrenia Neg  Hx   . Anxiety disorder Neg Hx     Past Medical History  Diagnosis Date  . Allergy   . Asthma   . Depression    Outpatient Encounter Prescriptions as of 06/21/2015  Medication Sig  . albuterol (PROVENTIL HFA;VENTOLIN HFA) 108 (90 BASE) MCG/ACT inhaler Inhale 2 puffs into the lungs as needed for wheezing.  Marland Kitchen buPROPion (WELLBUTRIN SR) 100 MG  12 hr tablet Take 1 tablet (100 mg total) by mouth 2 (two) times daily.  . busPIRone (BUSPAR) 10 MG tablet Take 1 tablet (10 mg total) by mouth 2 (two) times daily.  . hydrOXYzine (VISTARIL) 50 MG capsule Take 25-50mg  po qHS prn insomnia  . methocarbamol (ROBAXIN) 500 MG tablet Take 2 tablets (1,000 mg total) by mouth 4 (four) times daily as needed (Pain).  . naproxen (NAPROSYN) 500 MG tablet Take 1 tablet (500 mg total) by mouth 2 (two) times daily.  . pregabalin (LYRICA) 150 MG capsule Take 150 mg by mouth 2 (two) times daily.  . sertraline (ZOLOFT) 100 MG tablet Take 2 tablets (200 mg total) by mouth daily.  Marland Kitchen HYDROcodone-acetaminophen (NORCO/VICODIN) 5-325 MG per tablet Take 1-2 tablets by mouth every 6 hours as needed for pain and/or cough. (Patient not taking: Reported on 06/21/2015)  . oxyCODONE-acetaminophen (PERCOCET/ROXICET) 5-325 MG per tablet Take 2 tablets by mouth every 4 (four) hours as needed for severe pain. (Patient not taking: Reported on 06/21/2015)  . predniSONE (DELTASONE) 50 MG tablet Take 1 tablet daily with breakfast (Patient not taking: Reported on 06/21/2015)   No facility-administered encounter medications on file as of 06/21/2015.    Past Psychiatric History/Hospitalization(s): Anxiety: No Bipolar Disorder: No Depression: No Mania: No Psychosis: No Schizophrenia: No Personality Disorder: No Hospitalization for psychiatric illness: No History of Electroconvulsive Shock Therapy: No Prior Suicide Attempts: Yes  Physical Exam: Constitutional:  BP 132/80 mmHg  Pulse 94  Ht 5' 2.5" (1.588 m)  Wt 203 lb 9.6 oz (92.352 kg)  BMI 36.62 kg/m2  General Appearance: alert, oriented, no acute distress  Musculoskeletal: Strength & Muscle Tone: within normal limits Gait & Station: normal Patient leans: N/A  Mental Status Examination/Evaluation: Objective: Attitude: Calm and cooperative  Appearance: Fairly Groomed and Neat, appears to be stated age  Eye  Contact::  Good  Speech:  Clear and Coherent and Normal Rate  Volume:  Normal  Mood: anxious and depressed  Affect:  Congruent, Depressed and Tearful  Thought Process:  Goal Directed, Linear and Logical  Orientation:  Full (Time, Place, and Person)  Thought Content:  Negative  Suicidal Thoughts:  No  Homicidal Thoughts:  No  Judgement:  Good  Insight:  Good  Concentration: good  Memory: Immediate-good Recent-good Remote-good  Recall: fair  Language: fair  Gait and Station: normal  [employer] of Knowledge: average  Psychomotor Activity:  Normal  Akathisia:  No  Handed:  Right  AIMS (if indicated): n/a  Assets:  Communication Skills Desire for Improvement Housing Intimacy Social Support Location manager (Choose Three): Review of Psycho-Social Stressors (1), Review or order clinical lab tests (1), Established Problem, Worsening (2), Review of Medication Regimen & Side Effects (2) and Review of New Medication or Change in Dosage (2)  Assessment: AXIS I Generalized Anxiety Disorder and Major Depression, Recurrent moderate; Insomnia  AXIS II Deferred   Treatment Plan/Recommendations:  Plan of Care: Medication management with supportive therapy. Risks/benefits and SE of the medication discussed. Pt verbalized understanding  and verbal consent obtained for treatment. Affirm with the patient that the medications are taken as ordered. Patient expressed understanding of how their medications were to be used.    Laboratory: pt reminded to get labs before next visit  CBC Chemistry Profile HbAIC UDS Vitamin B-12 TSH  Psychotherapy: Therapy: brief supportive therapy provided. Discussed psychosocial stressors in detail.    Medications: Zoloft to  po qD for depression and anxiety Increase Buspar 15 mg po BID for anxiety D/c Vistaril  Start trial of Trazodone 50-100mg  po qHS prn  insomnia Increase Wellbutrin SR to  po BID for depression and concentration  Routine PRN Medications: Yes  Consultations: encouraged to restart individual therapy    Safety Concerns: Pt denies SI and is at an acute low risk for suicide.Patient told to call clinic if any problems occur. Patient advised to go to ER if they should develop SI/HI, side effects, or if symptoms worsen. Has crisis numbers to call if needed. Pt verbalized understanding.   Other: F/up in 2 months or sooner if needed.    Oletta Darter, MD 06/21/2015

## 2015-07-04 ENCOUNTER — Encounter (HOSPITAL_BASED_OUTPATIENT_CLINIC_OR_DEPARTMENT_OTHER): Payer: Self-pay | Admitting: *Deleted

## 2015-07-04 ENCOUNTER — Emergency Department (HOSPITAL_BASED_OUTPATIENT_CLINIC_OR_DEPARTMENT_OTHER)
Admission: EM | Admit: 2015-07-04 | Discharge: 2015-07-05 | Disposition: A | Discharge status: Home / Self Care | Payer: BLUE CROSS/BLUE SHIELD | Source: Home / Self Care | Attending: Emergency Medicine | Admitting: Emergency Medicine

## 2015-07-04 DIAGNOSIS — Z791 Long term (current) use of non-steroidal anti-inflammatories (NSAID): Secondary | ICD-10-CM

## 2015-07-04 DIAGNOSIS — J45901 Unspecified asthma with (acute) exacerbation: Secondary | ICD-10-CM

## 2015-07-04 DIAGNOSIS — R197 Diarrhea, unspecified: Secondary | ICD-10-CM | POA: Diagnosis not present

## 2015-07-04 DIAGNOSIS — F329 Major depressive disorder, single episode, unspecified: Secondary | ICD-10-CM

## 2015-07-04 DIAGNOSIS — Z79899 Other long term (current) drug therapy: Secondary | ICD-10-CM | POA: Diagnosis not present

## 2015-07-04 DIAGNOSIS — R0602 Shortness of breath: Secondary | ICD-10-CM | POA: Diagnosis present

## 2015-07-04 DIAGNOSIS — R0789 Other chest pain: Secondary | ICD-10-CM | POA: Diagnosis not present

## 2015-07-04 MED ORDER — IPRATROPIUM-ALBUTEROL 0.5-2.5 (3) MG/3ML IN SOLN
3.0000 mL | Freq: Once | RESPIRATORY_TRACT | Status: AC
  Administered 2015-07-04: 3 mL via RESPIRATORY_TRACT
  Filled 2015-07-04: qty 3

## 2015-07-04 MED ORDER — ALBUTEROL SULFATE (2.5 MG/3ML) 0.083% IN NEBU
2.5000 mg | INHALATION_SOLUTION | Freq: Once | RESPIRATORY_TRACT | Status: AC
  Administered 2015-07-04: 2.5 mg via RESPIRATORY_TRACT
  Filled 2015-07-04: qty 3

## 2015-07-04 NOTE — ED Notes (Signed)
Pt c/o SOB hx asthma no relief with albuterol  inhaler

## 2015-07-05 ENCOUNTER — Emergency Department (HOSPITAL_BASED_OUTPATIENT_CLINIC_OR_DEPARTMENT_OTHER)
Admission: EM | Admit: 2015-07-05 | Discharge: 2015-07-05 | Disposition: A | Discharge status: Home / Self Care | Payer: BLUE CROSS/BLUE SHIELD | Attending: Emergency Medicine | Admitting: Emergency Medicine

## 2015-07-05 ENCOUNTER — Emergency Department (HOSPITAL_BASED_OUTPATIENT_CLINIC_OR_DEPARTMENT_OTHER): Payer: BLUE CROSS/BLUE SHIELD

## 2015-07-05 ENCOUNTER — Encounter (HOSPITAL_BASED_OUTPATIENT_CLINIC_OR_DEPARTMENT_OTHER): Payer: Self-pay | Admitting: *Deleted

## 2015-07-05 DIAGNOSIS — Z79899 Other long term (current) drug therapy: Secondary | ICD-10-CM | POA: Insufficient documentation

## 2015-07-05 DIAGNOSIS — R197 Diarrhea, unspecified: Secondary | ICD-10-CM | POA: Insufficient documentation

## 2015-07-05 DIAGNOSIS — R0789 Other chest pain: Secondary | ICD-10-CM | POA: Insufficient documentation

## 2015-07-05 DIAGNOSIS — F329 Major depressive disorder, single episode, unspecified: Secondary | ICD-10-CM | POA: Insufficient documentation

## 2015-07-05 DIAGNOSIS — J45901 Unspecified asthma with (acute) exacerbation: Secondary | ICD-10-CM

## 2015-07-05 LAB — CBC WITH DIFFERENTIAL/PLATELET
Basophils Absolute: 0.1 10*3/uL (ref 0.0–0.1)
Basophils Relative: 1 %
EOS ABS: 0.4 10*3/uL (ref 0.0–0.7)
Eosinophils Relative: 3 %
HCT: 34 % — ABNORMAL LOW (ref 36.0–46.0)
Hemoglobin: 11.1 g/dL — ABNORMAL LOW (ref 12.0–15.0)
Lymphocytes Relative: 9 %
Lymphs Abs: 1.1 10*3/uL (ref 0.7–4.0)
MCH: 24.8 pg — ABNORMAL LOW (ref 26.0–34.0)
MCHC: 32.6 g/dL (ref 30.0–36.0)
MCV: 76.1 fL — ABNORMAL LOW (ref 78.0–100.0)
Monocytes Absolute: 0.9 10*3/uL (ref 0.1–1.0)
Monocytes Relative: 7 %
Neutro Abs: 10 10*3/uL — ABNORMAL HIGH (ref 1.7–7.7)
Neutrophils Relative %: 80 %
PLATELETS: 358 10*3/uL (ref 150–400)
RBC: 4.47 MIL/uL (ref 3.87–5.11)
RDW: 17.6 % — ABNORMAL HIGH (ref 11.5–15.5)
WBC: 12.5 10*3/uL — AB (ref 4.0–10.5)

## 2015-07-05 LAB — BASIC METABOLIC PANEL
Anion gap: 9 (ref 5–15)
BUN: 10 mg/dL (ref 6–20)
CHLORIDE: 105 mmol/L (ref 101–111)
CO2: 22 mmol/L (ref 22–32)
Calcium: 8.9 mg/dL (ref 8.9–10.3)
Creatinine, Ser: 0.93 mg/dL (ref 0.44–1.00)
GFR calc Af Amer: >60 mL/min (ref >60)
GFR calc non Af Amer: >60 mL/min (ref >60)
GLUCOSE: 115 mg/dL — AB (ref 65–99)
POTASSIUM: 3.3 mmol/L — AB (ref 3.5–5.1)
SODIUM: 136 mmol/L (ref 135–145)

## 2015-07-05 MED ORDER — IPRATROPIUM-ALBUTEROL 0.5-2.5 (3) MG/3ML IN SOLN
3.0000 mL | Freq: Once | RESPIRATORY_TRACT | Status: AC
  Administered 2015-07-05: 3 mL via RESPIRATORY_TRACT

## 2015-07-05 MED ORDER — ALBUTEROL (5 MG/ML) CONTINUOUS INHALATION SOLN
10.0000 mg/h | INHALATION_SOLUTION | Freq: Once | RESPIRATORY_TRACT | Status: AC
  Administered 2015-07-05: 10 mg/h via RESPIRATORY_TRACT
  Filled 2015-07-05: qty 20

## 2015-07-05 MED ORDER — ACETAMINOPHEN 325 MG PO TABS
650.0000 mg | ORAL_TABLET | Freq: Once | ORAL | Status: AC
  Administered 2015-07-05: 650 mg via ORAL
  Filled 2015-07-05: qty 2

## 2015-07-05 MED ORDER — ALBUTEROL SULFATE (2.5 MG/3ML) 0.083% IN NEBU
2.5000 mg | INHALATION_SOLUTION | Freq: Once | RESPIRATORY_TRACT | Status: AC
  Administered 2015-07-05: 2.5 mg via RESPIRATORY_TRACT

## 2015-07-05 MED ORDER — ALBUTEROL SULFATE (2.5 MG/3ML) 0.083% IN NEBU
INHALATION_SOLUTION | RESPIRATORY_TRACT | Status: AC
  Administered 2015-07-05: 2.5 mg
  Filled 2015-07-05: qty 3

## 2015-07-05 MED ORDER — IPRATROPIUM-ALBUTEROL 0.5-2.5 (3) MG/3ML IN SOLN
RESPIRATORY_TRACT | Status: AC
  Administered 2015-07-05: 3 mL via RESPIRATORY_TRACT
  Filled 2015-07-05: qty 3

## 2015-07-05 MED ORDER — SODIUM CHLORIDE 0.9 % IV BOLUS (SEPSIS)
1000.0000 mL | Freq: Once | INTRAVENOUS | Status: AC
  Administered 2015-07-05: 1000 mL via INTRAVENOUS

## 2015-07-05 MED ORDER — ALBUTEROL SULFATE HFA 108 (90 BASE) MCG/ACT IN AERS: 1.0000 | INHALATION_SPRAY | Freq: Four times a day (QID) | RESPIRATORY_TRACT | Status: AC | PRN

## 2015-07-05 MED ORDER — ALBUTEROL SULFATE (2.5 MG/3ML) 0.083% IN NEBU
INHALATION_SOLUTION | RESPIRATORY_TRACT | Status: AC
  Administered 2015-07-05: 2.5 mg via RESPIRATORY_TRACT
  Filled 2015-07-05: qty 3

## 2015-07-05 MED ORDER — PREDNISONE 10 MG PO TABS: 20.0000 mg | ORAL_TABLET | Freq: Two times a day (BID) | ORAL | Status: DC

## 2015-07-05 MED ORDER — IPRATROPIUM-ALBUTEROL 0.5-2.5 (3) MG/3ML IN SOLN
RESPIRATORY_TRACT | Status: AC
  Administered 2015-07-05: 3 mL
  Filled 2015-07-05: qty 3

## 2015-07-05 MED ORDER — METHYLPREDNISOLONE SODIUM SUCC 125 MG IJ SOLR
125.0000 mg | Freq: Once | INTRAMUSCULAR | Status: AC
  Administered 2015-07-05: 125 mg via INTRAVENOUS
  Filled 2015-07-05: qty 2

## 2015-07-05 NOTE — ED Notes (Signed)
Patient stable and ambulatory.  Patient verbalizes understanding of discharge medications, instructions and follow-up. 

## 2015-07-05 NOTE — ED Notes (Signed)
Pt states she needs a nebulizer machine at home.

## 2015-07-05 NOTE — Discharge Instructions (Signed)
Asthma, Adult °Asthma is a recurring condition in which the airways tighten and narrow. Asthma can make it difficult to breathe. It can cause coughing, wheezing, and shortness of breath. Asthma episodes, also called asthma attacks, range from minor to life-threatening. Asthma cannot be cured, but medicines and lifestyle changes can help control it. °CAUSES °Asthma is believed to be caused by inherited (genetic) and environmental factors, but its exact cause is unknown. Asthma may be triggered by allergens, lung infections, or irritants in the air. Asthma triggers are different for each person. Common triggers include:  °· Animal dander. °· Dust mites. °· Cockroaches. °· Pollen from trees or grass. °· Mold. °· Smoke. °· Air pollutants such as dust, household cleaners, hair sprays, aerosol sprays, paint fumes, strong chemicals, or strong odors. °· Cold air, weather changes, and winds (which increase molds and pollens in the air). °· Strong emotional expressions such as crying or laughing hard. °· Stress. °· Certain medicines (such as aspirin) or types of drugs (such as beta-blockers). °· Sulfites in foods and drinks. Foods and drinks that may contain sulfites include dried fruit, potato chips, and sparkling grape juice. °· Infections or inflammatory conditions such as the flu, a cold, or an inflammation of the nasal membranes (rhinitis). °· Gastroesophageal reflux disease (GERD). °· Exercise or strenuous activity. °SYMPTOMS °Symptoms may occur immediately after asthma is triggered or many hours later. Symptoms include: °· Wheezing. °· Excessive nighttime or early morning coughing. °· Frequent or severe coughing with a common cold. °· Chest tightness. °· Shortness of breath. °DIAGNOSIS  °The diagnosis of asthma is made by a review of your medical history and a physical exam. Tests may also be performed. These may include: °· Lung function studies. These tests show how much air you breathe in and out. °· Allergy  tests. °· Imaging tests such as X-rays. °TREATMENT  °Asthma cannot be cured, but it can usually be controlled. Treatment involves identifying and avoiding your asthma triggers. It also involves medicines. There are 2 classes of medicine used for asthma treatment:  °· Controller medicines. These prevent asthma symptoms from occurring. They are usually taken every day. °· Reliever or rescue medicines. These quickly relieve asthma symptoms. They are used as needed and provide short-term relief. °Your health care provider will help you create an asthma action plan. An asthma action plan is a written plan for managing and treating your asthma attacks. It includes a list of your asthma triggers and how they may be avoided. It also includes information on when medicines should be taken and when their dosage should be changed. An action plan may also involve the use of a device called a peak flow meter. A peak flow meter measures how well the lungs are working. It helps you monitor your condition. °HOME CARE INSTRUCTIONS  °· Take medicines only as directed by your health care provider. Speak with your health care provider if you have questions about how or when to take the medicines. °· Use a peak flow meter as directed by your health care provider. Record and keep track of readings. °· Understand and use the action plan to help minimize or stop an asthma attack without needing to seek medical care. °· Control your home environment in the following ways to help prevent asthma attacks: °· Do not smoke. Avoid being exposed to secondhand smoke. °· Change your heating and air conditioning filter regularly. °· Limit your use of fireplaces and wood stoves. °· Get rid of pests (such as roaches   and mice) and their droppings. °· Throw away plants if you see mold on them. °· Clean your floors and dust regularly. Use unscented cleaning products. °· Try to have someone else vacuum for you regularly. Stay out of rooms while they are  being vacuumed and for a short while afterward. If you vacuum, use a dust mask from a hardware store, a double-layered or microfilter vacuum cleaner bag, or a vacuum cleaner with a HEPA filter. °· Replace carpet with wood, tile, or vinyl flooring. Carpet can trap dander and dust. °· Use allergy-proof pillows, mattress covers, and box spring covers. °· Wash bed sheets and blankets every week in hot water and dry them in a dryer. °· Use blankets that are made of polyester or cotton. °· Clean bathrooms and kitchens with bleach. If possible, have someone repaint the walls in these rooms with mold-resistant paint. Keep out of the rooms that are being cleaned and painted. °· Wash hands frequently. °SEEK MEDICAL CARE IF:  °· You have wheezing, shortness of breath, or a cough even if taking medicine to prevent attacks. °· The colored mucus you cough up (sputum) is thicker than usual. °· Your sputum changes from clear or white to yellow, green, gray, or bloody. °· You have any problems that may be related to the medicines you are taking (such as a rash, itching, swelling, or trouble breathing). °· You are using a reliever medicine more than 2-3 times per week. °· Your peak flow is still at 50-79% of your personal best after following your action plan for 1 hour. °· You have a fever. °SEEK IMMEDIATE MEDICAL CARE IF:  °· You seem to be getting worse and are unresponsive to treatment during an asthma attack. °· You are short of breath even at rest. °· You get short of breath when doing very little physical activity. °· You have difficulty eating, drinking, or talking due to asthma symptoms. °· You develop chest pain. °· You develop a fast heartbeat. °· You have a bluish color to your lips or fingernails. °· You are light-headed, dizzy, or faint. °· Your peak flow is less than 50% of your personal best. °  °This information is not intended to replace advice given to you by your health care provider. Make sure you discuss any  questions you have with your health care provider. °  °Document Released: 06/25/2005 Document Revised: 03/16/2015 Document Reviewed: 01/22/2013 °Elsevier Interactive Patient Education ©2016 Elsevier Inc. ° ° °Asthma Attack Prevention °While you may not be able to control the fact that you have asthma, you can take actions to prevent asthma attacks. The best way to prevent asthma attacks is to maintain good control of your asthma. You can achieve this by: °· Taking your medicines as directed. °· Avoiding things that can irritate your airways or make your asthma symptoms worse (asthma triggers). °· Keeping track of how well your asthma is controlled and of any changes in your symptoms. °· Responding quickly to worsening asthma symptoms (asthma attack). °· Seeking emergency care when it is needed. °WHAT ARE SOME WAYS TO PREVENT AN ASTHMA ATTACK? °Have a Plan °Work with your health care provider to create a written plan for managing and treating your asthma attacks (asthma action plan). This plan includes: °· A list of your asthma triggers and how you can avoid them. °· Information on when medicines should be taken and when their dosages should be changed. °· The use of a device that measures how well your lungs   are working (peak flow meter). °Monitor Your Asthma °Use your peak flow meter and record your results in a journal every day. A drop in your peak flow numbers on one or more days may indicate the start of an asthma attack. This can happen even before you start to feel symptoms. You can prevent an asthma attack from getting worse by following the steps in your asthma action plan. °Avoid Asthma Triggers °Work with your asthma health care provider to find out what your asthma triggers are. This can be done by: °· Allergy testing. °· Keeping a journal that notes when asthma attacks occur and the factors that may have contributed to them. °· Determining if there are other medical conditions that are making your  asthma worse. °Once you have determined your asthma triggers, take steps to avoid them. This may include avoiding excessive or prolonged exposure to: °· Dust. Have someone dust and vacuum your home for you once or twice a week. Using a high-efficiency particulate arrestance (HEPA) vacuum is best. °· Smoke. This includes campfire smoke, forest fire smoke, and secondhand smoke from tobacco products. °· Pet dander. Avoid contact with animals that you know you are allergic to. °· Allergens from trees, grasses or pollens. Avoid spending a lot of time outdoors when pollen counts are high, and on very windy days. °· Very cold, dry, or humid air. °· Mold. °· Foods that contain high amounts of sulfites. °· Strong odors. °· Outdoor air pollutants, such as engine exhaust. °· Indoor air pollutants, such as aerosol sprays and fumes from household cleaners. °· Household pests, including dust mites and cockroaches, and pest droppings. °· Certain medicines, including NSAIDs. Always talk to your health care provider before stopping or starting any new medicines. °Medicines °Take over-the-counter and prescription medicines only as told by your health care provider. Many asthma attacks can be prevented by carefully following your medicine schedule. Taking your medicines correctly is especially important when you cannot avoid certain asthma triggers. °Act Quickly °If an asthma attack does happen, acting quickly can decrease how severe it is and how long it lasts. Take these steps:  °· Pay attention to your symptoms. If you are coughing, wheezing, or having difficulty breathing, do not wait to see if your symptoms go away on their own. Follow your asthma action plan. °· If you have followed your asthma action plan and your symptoms are not improving, call your health care provider or seek immediate medical care at the nearest hospital. °It is important to note how often you need to use your fast-acting rescue inhaler. If you are using  your rescue inhaler more often, it may mean that your asthma is not under control. Adjusting your asthma treatment plan may help you to prevent future asthma attacks and help you to gain better control of your condition. °HOW CAN I PREVENT AN ASTHMA ATTACK WHEN I EXERCISE? °Follow advice from your health care provider about whether you should use your fast-acting inhaler before exercising. Many people with asthma experience exercise-induced bronchoconstriction (EIB). This condition often worsens during vigorous exercise in cold, humid, or dry environments. Usually, people with EIB can stay very active by pre-treating with a fast-acting inhaler before exercising. °  °This information is not intended to replace advice given to you by your health care provider. Make sure you discuss any questions you have with your health care provider. °  °Document Released: 06/13/2009 Document Revised: 03/16/2015 Document Reviewed: 11/25/2014 °Elsevier Interactive Patient Education ©2016 Elsevier Inc. ° ° °  Emergency Department Resource Guide °1) Find a Doctor and Pay Out of Pocket °Although you won't have to find out who is covered by your insurance plan, it is a good idea to ask around and get recommendations. You will then need to call the office and see if the doctor you have chosen will accept you as a new patient and what types of options they offer for patients who are self-pay. Some doctors offer discounts or will set up payment plans for their patients who do not have insurance, but you will need to ask so you aren't surprised when you get to your appointment. ° °2) Contact Your Local Health Department °Not all health departments have doctors that can see patients for sick visits, but many do, so it is worth a call to see if yours does. If you don't know where your local health department is, you can check in your phone book. The CDC also has a tool to help you locate your state's health department, and many state websites  also have listings of all of their local health departments. ° °3) Find a Walk-in Clinic °If your illness is not likely to be very severe or complicated, you may want to try a walk in clinic. These are popping up all over the country in pharmacies, drugstores, and shopping centers. They're usually staffed by nurse practitioners or physician assistants that have been trained to treat common illnesses and complaints. They're usually fairly quick and inexpensive. However, if you have serious medical issues or chronic medical problems, these are probably not your best option. ° °No Primary Care Doctor: °- Call [facility] at  832-8000 - they can help you locate a primary care doctor that  accepts your insurance, provides certain services, etc. °- Physician Referral Service- 1-800-533-3463 ° °Chronic Pain Problems: °Organization         Address  Phone   Notes  °[facility]  (336) 297-2271 Patients need to be referred by their primary care doctor.  ° °Medication Assistance: °Organization         Address  Phone   Notes  °[place] [facility] [address] °[place], [place] [address] (336) 641-8030 --Must be a resident of [place] °-- Must have NO insurance coverage whatsoever (no Medicaid/ Medicare, etc.) °-- The pt. MUST have a primary care doctor that directs their care regularly and follows them in the community °  °[facility]  (866) 331-1348   °[employer]  (888) 892-1162   ° °Agencies that provide inexpensive medical care: °Organization         Address  Phone   Notes  °[facility]  (336) 832-8035   °[facility]    (336) 832-7272   °[facility] [address] °[place], [place] [address] (336) 832-4777   °[facility] [address], °[place] (336) 271-4999   °[facility]    (336) 373-0678   °[facility]    (336) 272-1050   °[facility] °  [address], [place] Phone:  (336) 832-4444, Fax:  (336) 832-4440 Hours of Operation:  9 am - 6 pm, M-F.  Also accepts Medicaid/Medicare and self-pay.  °[facility] ° [address], [place] Phone: (336) [phone], Fax: (336) [phone]. Hours of Operation:  8:30 am - 5:30 pm, M-F.  Also accepts Medicaid and self-pay.  °[facility] [place]   [address], [place] Phone: (336) 878-6027   °[facility] [address], [place], [place] (336)723-1848, Ext. 123 Mondays & Thursdays: 7-9 AM.  First 15 patients are seen on a first come, first serve basis. °  ° °Medicaid-accepting [place] Providers: ° °Organization         Address  Phone   Notes  °[facility] [address], [place] (336) [phone] Also accepts self-pay patients.  °[facility] [address], [place] ° (336) [phone]   °[facility] [address], [place] (336) 288-8857   °[facility] [address] [place] [address], [place] (336) 299-7000   °Veita Bland [address], [place]  ° (336) [phone] Only accepts [place] Access Medicaid patients after they have their name applied to their card.  ° °Self-Pay (no insurance) in [place]: ° °Organization         Address  Phone   Notes  °Sickle Cell Patients, [facility] [address], [place] (336) 832-1970   °[facility] [address], [place] (336) 832-4400   °[facility] ° [address], [place] (336) 992-4800   °[facility]/Dr. Osei-Bonsu ° [address] [place] [address], [place] or [address], [place] (336) [phone] Phone number for both [place] and [place] locations is the same.  °[facility] [address], [place] (336) 299-0000   °[facility] [address] [place] [address],  [place] or [address] (336) 852-7530 °(336) 878-2260   °[facility] [address], [place] (336) 350-1642, phone; (336) 294-5005, fax Sees patients 1st and 3rd Saturday of every month.  Must not qualify for public or private insurance (i.e. Medicaid, Medicare, [place] Health Choice, Veterans' Benefits) • Household income should be no more than 200% of the poverty level •The clinic cannot treat you if you are pregnant or think you are pregnant • Sexually transmitted diseases are not treated at the clinic.  ° ° °Dental Care: °Organization         Address  Phone  Notes  °[facility] [address], [place] (336) 641-6152 Accepts children up to age 21 who are enrolled in Medicaid or [place] Health Choice; pregnant women with a Medicaid card; and children who have applied for Medicaid or [place] Health Choice, but were declined, whose parents can pay a reduced fee at time of service.  °[facility] [place]  [address], [place] (336) 641-7733 Accepts children up to age 21 who are enrolled in Medicaid or [place] Health Choice; pregnant women with a Medicaid card; and children who have applied for Medicaid or [place] Health Choice, but were declined, whose parents can pay a reduced fee at time of service.  °[facility] ° [address], [place] (336) 641-4533 Patients are seen by appointment only. Walk-ins are not accepted. [facility] will see patients 18 years of age and older. °Monday - Tuesday (8am-5pm) °Most Wednesdays (8:30-5pm) °$30 per visit, cash only  °[facility] ° [address], [place] (336) 641-4533 Patients are seen by appointment only. Walk-ins are not accepted. [facility] will see patients 18 years of age and older. °One   Wednesday Evening (Monthly: Volunteer Based).  $30 per visit, cash only  °[facility]  (919) [phone] for adults; Children under age 4, call Graduate Pediatric Dentistry at ([phone]) [phone]. Children aged 4-14, please call ([phone]) [phone] to request a pediatric application. ° Dental services are provided in all areas of dental care including fillings, crowns and bridges, complete and partial dentures, implants, gum treatment, root canals, and extractions. Preventive care is also provided. Treatment is provided to both adults and children. °Patients are selected via a lottery and there is often a waiting list. °  °[facility] [address], °[place] ° (336) 763-8833 [email or web address] °  °[facility] [address], [place], [place] (336)723-1848, Ext. 123 Second and Fourth Thursday of each month, opens at 6:30 AM; Clinic ends at 9 AM.  Patients are seen on a first-come first-served basis, and a limited number are seen during each clinic.  ° °[facility] ° [address], [place], [place] (336) 723-7904   Eligibility Requirements °You must have lived in [place], or [place] counties for at least the last three months. °  You cannot be eligible for state or federal sponsored healthcare insurance, including Veterans Administration, Medicaid, or Medicare. °  You generally cannot be eligible for healthcare insurance through your employer.  °  How to apply: °Eligibility screenings are held every Tuesday and Wednesday afternoon from 1:00 pm until 4:00 pm. You do not need an appointment for the interview!  °[facility] [address], [place], [place] 336-631-2330   °[facility]  336-342-8273   °[facility]  336-703-3100   °[facility]  336-570-6415   ° °Behavioral Health Resources in the Community: °Intensive Outpatient Programs °Organization         Address  Phone  Notes  °[facility] [address], [place], [place] 336-878-6098     °[facility] [address], [place], [place] 336-832-9800   °[facility] [address], [place], [place] ° 336-882-2125   °[facility] [address],  °[place], [place] 1-800-853-5163 or 336-641-4981   °Substance Abuse Resources °Organization         Address  Phone  Notes  °[facility]  336-882-2125   °[facility]  336-784-9470   °[facility]  336-285-9073   °[facility]  336-845-3988   °Residential & Outpatient Substance Abuse Program  1-800-659-3381   °Psychological Services °Organization         Address  Phone  Notes  ° Health  336- 832-9600   °[facility]  336- 378-7881   °[facility] [address], [place] 1-800-853-5163 or 336-641-4981   ° °Mobile Crisis Teams °Organization         Address  Phone  Notes  °[facility], Mobile Crisis Care Unit  1-877-626-1772   °Assertive °[facility] ° [address] [place], [place] 336-834-9664   °Sharon DeEsch [address] °[place] 336-554-5454   ° °Self-Help/Support Groups °Organization         Address  Phone             Notes  °[facility]  - variety of support groups  336- 373-1402 Call for more information  °Narcotics Anonymous (NA), [facility] [address], °[place]  2 meetings at this   location  ° °Residential Treatment Programs °Organization         Address  Phone  Notes  °[facility] [address],    °[place]  1-866-801-8205   °[facility] ° [address], [place], [place] 704-293-8524   °[facility] [address], [place] 336-845-3988 Admissions: 8am-3pm M-F  °[facility] [address]    °[place], [place] 336-841-1104   °[facility] [address], [place], [place] 336-379-7146   °[facility] [address]  °[place],  [place] 336-285-9073   °[facility] - Intensive Outpatient [address], [place], [place] 336-852-3033   °[facility] [address]  °[place], [place] 1-877-615-2722 or 336-784-9470   °[facility] ([facility] [address] [place], [place] 336-227-7417 Accepts Medicaid  °[facility] [address]  ° [place] 1-800-659-3381 Substance Abuse/Addiction Treatment  ° °[place] Behavioral Health Resources °Organization         Address  Phone  Notes  °[facility]  (888) 581-9988   °Julie Brannon, PhD [address] [place], [place]   (336) 349-5553 or (336) 951-0000   °[facility]   [address] °[place], [place] (336) 349-4454   °[facility] [address], [place], [place] (336) 342-8316 Insurance/Medicaid/sponsorship through [facility]  °[facility] [address]                                    [place], [place] (336) 342-8316 Therapy/tele-psych/case  °[facility] [address]  ° [place], [place] (336) 349-2233    °Dr. Arfeen  (336) 349-4544   °[facility] [place]  [employer] [place] [facility] 1) [address], [place] °2) [address], [place] °3)  [address], [place] (336) 349-3220 °(336) 342-7768 ° °(336) 342-8140   °[place] [facility] (336) 342-1394 or (336) 342-3537 (After Hours)    ° ° ° °

## 2015-07-05 NOTE — ED Provider Notes (Signed)
CSN: [id number]     Arrival date & time 07/05/15  1503 History   First MD Initiated Contact with Patient 07/05/15 1718     Chief Complaint  Patient presents with  . Shortness of Breath     (Consider location/radiation/quality/duration/timing/severity/associated sxs/prior Treatment) Patient is a 29 y.o. female presenting with shortness of breath. The history is provided by the patient.  Shortness of Breath Severity:  Moderate Onset quality:  Gradual Duration: this morning. Timing:  Constant Progression:  Worsening Context: weather changes   Relieved by:  Inhaler Worsened by:  Nothing tried Ineffective treatments:  None tried Associated symptoms: cough and wheezing   Associated symptoms: no abdominal pain, no chest pain, no fever, no headaches, no neck pain and no vomiting    Patient seen here yesterday for the same symptoms.  Patient reports she felt much improved upon discharge, but was unable to get her prednisone filled.  Her symptoms consistent with her asthma exacerbations began again this morning and have a progressively gotten worse throughout the day.   Past Medical History  Diagnosis Date  . Allergy   . Asthma   . Depression    Past Surgical History  Procedure Laterality Date  . Breast surgery     Family History  Problem Relation Age of Onset  . Kidney failure Father   . Cancer Maternal Grandfather   . Cancer Paternal Grandfather   . Depression Mother   . Suicidality Neg Hx   . Seizures Neg Hx   . Schizophrenia Neg Hx   . Anxiety disorder Neg Hx    Social History  Substance Use Topics  . Smoking status: Never Smoker   . Smokeless tobacco: Never Used  . Alcohol Use: Yes     Comment: occ- 2x/month 1-2 drinks per episode   OB History    No data available     Review of Systems  Constitutional: Negative for fever.  Respiratory: Positive for cough, chest tightness, shortness of breath and wheezing.   Cardiovascular: Negative for chest pain and leg  swelling.  Gastrointestinal: Positive for diarrhea. Negative for nausea, vomiting and abdominal pain.  Musculoskeletal: Negative for neck pain.  Skin: Negative for pallor.  Neurological: Negative for headaches.  All other systems reviewed and are negative.     Allergies  Review of patient's allergies indicates no known allergies.  Home Medications   Prior to Admission medications   Medication Sig Start Date End Date Taking? Authorizing Provider  albuterol (PROVENTIL HFA;VENTOLIN HFA) 108 (90 Base) MCG/ACT inhaler Inhale 1-2 puffs into the lungs every 6 (six) hours as needed for wheezing or shortness of breath. 07/05/15   Cheri Fowler, PA-C  buPROPion (WELLBUTRIN SR) 150 MG 12 hr tablet Take 1 tablet (150 mg total) by mouth 2 (two) times daily. 06/21/15 06/20/16  Oletta Darter, MD  busPIRone (BUSPAR) 15 MG tablet Take 1 tablet (15 mg total) by mouth 2 (two) times daily. 06/21/15   Oletta Darter, MD  HYDROcodone-acetaminophen (NORCO/VICODIN) 5-325 MG per tablet Take 1-2 tablets by mouth every 6 hours as needed for pain and/or cough. Patient not taking: Reported on 06/21/2015 02/13/15   Joni Reining Pisciotta, PA-C  methocarbamol (ROBAXIN) 500 MG tablet Take 2 tablets (1,000 mg total) by mouth 4 (four) times daily as needed (Pain). 02/13/15   Nicole Pisciotta, PA-C  naproxen (NAPROSYN) 500 MG tablet Take 1 tablet (500 mg total) by mouth 2 (two) times daily. 12/14/14   Hanna Patel-Mills, PA-C  oxyCODONE-acetaminophen (PERCOCET/ROXICET) 5-325 MG per tablet Take  2 tablets by mouth every 4 (four) hours as needed for severe pain. Patient not taking: Reported on 06/21/2015 12/14/14   Catha GosselinHanna Patel-Mills, PA-C  predniSONE (DELTASONE) 10 MG tablet Take 2 tablets (20 mg total) by mouth 2 (two) times daily. 07/05/15   Geoffery Lyonsouglas Delo, MD  pregabalin (LYRICA) 150 MG capsule Take 150 mg by mouth 2 (two) times daily.    Historical Provider, MD  sertraline (ZOLOFT) 100 MG tablet Take 2 tablets (200 mg total) by mouth daily.  06/21/15   Oletta DarterSalina Agarwal, MD  traZODone (DESYREL) 50 MG tablet Take 1-2 tabs po qHS prn insomnia 06/21/15   Oletta DarterSalina Agarwal, MD   BP 123/73 mmHg  Pulse 124  Temp(Src) 99.8 F (37.7 C) (Oral)  Resp 19  SpO2 97%  LMP 06/17/2015 Physical Exam  Constitutional: She is oriented to person, place, and time. She appears well-developed and well-nourished.  HENT:  Head: Normocephalic and atraumatic.  Mouth/Throat: Oropharynx is clear and moist.  Eyes: Conjunctivae are normal. Pupils are equal, round, and reactive to light.  Neck: Normal range of motion. Neck supple.  Cardiovascular: Normal rate, regular rhythm and normal heart sounds.   No murmur heard. Pulmonary/Chest: Effort normal. No accessory muscle usage or stridor. Tachypnea noted. No respiratory distress. She has wheezes. She has no rhonchi. She has no rales.  Abdominal: Soft. Bowel sounds are normal. She exhibits no distension. There is no tenderness.  Musculoskeletal: Normal range of motion.  Lymphadenopathy:    She has no cervical adenopathy.  Neurological: She is alert and oriented to person, place, and time.  Speech clear without dysarthria.  Skin: Skin is warm and dry.  Psychiatric: She has a normal mood and affect. Her behavior is normal.    ED Course  Procedures (including critical care time) Labs Review Labs Reviewed  BASIC METABOLIC PANEL - Abnormal; Notable for the following:    Potassium 3.3 (*)    Glucose, Bld 115 (*)    All other components within normal limits  CBC WITH DIFFERENTIAL/PLATELET - Abnormal; Notable for the following:    WBC 12.5 (*)    Hemoglobin 11.1 (*)    HCT 34.0 (*)    MCV 76.1 (*)    MCH 24.8 (*)    RDW 17.6 (*)    Neutro Abs 10.0 (*)    All other components within normal limits    Imaging Review Dg Chest 2 View  07/05/2015  CLINICAL DATA:  Three day history of chest pain, cough, wheezing, body aches and fever. EXAM: CHEST  2 VIEW COMPARISON:  12/07/2012. FINDINGS: Cardiomediastinal  silhouette unremarkable, unchanged. Mildly prominent bronchovascular markings diffusely and moderate central peribronchial thickening, more so than on the prior examination. Lungs otherwise clear. No localized airspace consolidation. No pleural effusions. No pneumothorax. Normal pulmonary vascularity. Visualized bony thorax intact. IMPRESSION: Moderate changes of acute bronchitis and/or asthma without focal airspace pneumonia. Electronically Signed   By: Hulan Saashomas  Lawrence M.D.   On: 07/05/2015 18:01   I have personally reviewed and evaluated these images and lab results as part of my medical decision-making.   EKG Interpretation None      MDM   Final diagnoses:  Asthma exacerbation    Patient with asthma presents with symptoms consistent with her asthma exacerbation.  Seen here yesterday, but has not gotten her prescription for prednisone filled.  Blood pressure 123/73, pulse 123, temperature 99.8 F (37.7 C), temperature source Oral, resp. rate 20, last menstrual period 06/17/2015, SpO2 97 %.  On exam, heart sounds  with tachycardia, lungs with wheezing, no signs of respiratory distress or accessory muscle usage.  Abdomen soft and benign.  Will obtain labs and CXR.  Will give fluids, solumedrol, and CAT.  Doubt PE. Will ambulate in hallway.  Patient's symptoms have improved after breathing treatment.  Patient reports she has the prednisone prescription filled from yesterday.  CXR shows moderate changes of acute bronchitis and/or asthma without focal airspace PNA. BMP, CBC unremarkable. Evaluation does not show pathology requiring ongoing emergent intervention or admission. Pt is hemodynamically stable and mentating appropriately. Discussed findings/results and plan with patient/guardian, who agrees with plan. All questions answered. Return precautions discussed and outpatient follow up given. Case has been discussed with Dr. Donnald Garre who agrees with the above plan for discharge.       Cheri Fowler, PA-C 07/05/15 1944  Arby Barrette, MD 07/13/15 0100

## 2015-07-05 NOTE — ED Provider Notes (Signed)
CSN: [id number]     Arrival date & time 07/04/15  2302 History   By signing my name below, I, Sylvia Parker, attest that this documentation has been prepared under the direction and in the presence of Geoffery Lyonsouglas Diavian Furgason, MD. Electronically Signed: Bethel BornBritney Parker, ED Scribe. 07/05/2015. 12:28 AM   Chief Complaint  Patient presents with  . Shortness of Breath   The history is provided by the patient. No language interpreter was used.   Sylvia Parker is a 29 y.o. female with history of asthma  who presents to the Emergency Department complaining of SOB with onset around 5 PM yesterday.  Associated symptoms include wheezing and dry cough. Her home inhalers provided insufficient relief and she does not have a nebulizer machine at home. She feels better after a breathing treatment in the ED.  Pt states that it has been 10 years since her last asthma exacerbation but at that time she was treated with steroids.  Pt denies fever, chest pain, and LE swelling or pain.   Past Medical History  Diagnosis Date  . Allergy   . Asthma   . Depression    Past Surgical History  Procedure Laterality Date  . Breast surgery     Family History  Problem Relation Age of Onset  . Kidney failure Father   . Cancer Maternal Grandfather   . Cancer Paternal Grandfather   . Depression Mother   . Suicidality Neg Hx   . Seizures Neg Hx   . Schizophrenia Neg Hx   . Anxiety disorder Neg Hx    Social History  Substance Use Topics  . Smoking status: Never Smoker   . Smokeless tobacco: Never Used  . Alcohol Use: Yes     Comment: occ- 2x/month 1-2 drinks per episode   OB History    No data available     Review of Systems 10 Systems reviewed and all are negative for acute change except as noted in the HPI. Allergies  Review of patient's allergies indicates no known allergies.  Home Medications   Prior to Admission medications   Medication Sig Start Date End Date Taking? Authorizing Provider   albuterol (PROVENTIL HFA;VENTOLIN HFA) 108 (90 BASE) MCG/ACT inhaler Inhale 2 puffs into the lungs as needed for wheezing.    Historical Provider, MD  buPROPion (WELLBUTRIN SR) 150 MG 12 hr tablet Take 1 tablet (150 mg total) by mouth 2 (two) times daily. 06/21/15 06/20/16  Oletta DarterSalina Agarwal, MD  busPIRone (BUSPAR) 15 MG tablet Take 1 tablet (15 mg total) by mouth 2 (two) times daily. 06/21/15   Oletta DarterSalina Agarwal, MD  HYDROcodone-acetaminophen (NORCO/VICODIN) 5-325 MG per tablet Take 1-2 tablets by mouth every 6 hours as needed for pain and/or cough. Patient not taking: Reported on 06/21/2015 02/13/15   Joni ReiningNicole Pisciotta, PA-C  methocarbamol (ROBAXIN) 500 MG tablet Take 2 tablets (1,000 mg total) by mouth 4 (four) times daily as needed (Pain). 02/13/15   Nicole Pisciotta, PA-C  naproxen (NAPROSYN) 500 MG tablet Take 1 tablet (500 mg total) by mouth 2 (two) times daily. 12/14/14   Hanna Patel-Mills, PA-C  oxyCODONE-acetaminophen (PERCOCET/ROXICET) 5-325 MG per tablet Take 2 tablets by mouth every 4 (four) hours as needed for severe pain. Patient not taking: Reported on 06/21/2015 12/14/14   Catha GosselinHanna Patel-Mills, PA-C  predniSONE (DELTASONE) 50 MG tablet Take 1 tablet daily with breakfast Patient not taking: Reported on 06/21/2015 02/13/15   Joni ReiningNicole Pisciotta, PA-C  pregabalin (LYRICA) 150 MG capsule Take 150 mg by mouth 2 (  two) times daily.    Historical Provider, MD  sertraline (ZOLOFT) 100 MG tablet Take 2 tablets (200 mg total) by mouth daily. 06/21/15   Oletta Darter, MD  traZODone (DESYREL) 50 MG tablet Take 1-2 tabs po qHS prn insomnia 06/21/15   Oletta Darter, MD   BP 126/85 mmHg  Pulse 92  Temp(Src) 94 F (34.4 C) (Oral)  Resp 30  SpO2 100%  LMP 06/17/2015 Physical Exam  Constitutional: She is oriented to person, place, and time. She appears well-developed and well-nourished. No distress.  HENT:  Head: Normocephalic and atraumatic.  Eyes: EOM are normal. Pupils are equal, round, and reactive to  light.  Neck: Normal range of motion.  Cardiovascular: Normal rate, regular rhythm and normal heart sounds.   Pulmonary/Chest: Effort normal and breath sounds normal. No respiratory distress. She has no wheezes. She has no rales.  Abdominal: Soft. She exhibits no distension. There is no tenderness.  Musculoskeletal: Normal range of motion.  Neurological: She is alert and oriented to person, place, and time.  Skin: Skin is warm and dry.  Psychiatric: She has a normal mood and affect. Judgment normal.  Nursing note and vitals reviewed.   ED Course  Procedures (including critical care time) DIAGNOSTIC STUDIES: Oxygen Saturation is 100% on RA,  normal by my interpretation.    COORDINATION OF CARE: 12:26 AM Discussed treatment plan which includes steroids with pt at bedside and pt agreed to plan.  Labs Review Labs Reviewed - No data to display  Imaging Review No results found.   EKG Interpretation None      MDM   Final diagnoses:  Asthma exacerbation    This appears to be an asthma exacerbation. We'll treat with prednisone and continued inhaler. Her oxygen saturations are 100% after a nebulizer treatment, and she is presently in no respiratory distress.  I personally performed the services described in this documentation, which was scribed in my presence. The recorded information has been reviewed and is accurate.        Geoffery Lyons, MD 07/05/15 [phone]

## 2015-07-05 NOTE — Discharge Instructions (Signed)
Prednisone as prescribed.  Continue your albuterol: 2 puffs every 4 hours as needed for wheezing.  Return to the ER if symptoms acutely worsen or change.   Asthma, Adult Asthma is a recurring condition in which the airways tighten and narrow. Asthma can make it difficult to breathe. It can cause coughing, wheezing, and shortness of breath. Asthma episodes, also called asthma attacks, range from minor to life-threatening. Asthma cannot be cured, but medicines and lifestyle changes can help control it. CAUSES Asthma is believed to be caused by inherited (genetic) and environmental factors, but its exact cause is unknown. Asthma may be triggered by allergens, lung infections, or irritants in the air. Asthma triggers are different for each person. Common triggers include:   Animal dander.  Dust mites.  Cockroaches.  Pollen from trees or grass.  Mold.  Smoke.  Air pollutants such as dust, household cleaners, hair sprays, aerosol sprays, paint fumes, strong chemicals, or strong odors.  Cold air, weather changes, and winds (which increase molds and pollens in the air).  Strong emotional expressions such as crying or laughing hard.  Stress.  Certain medicines (such as aspirin) or types of drugs (such as beta-blockers).  Sulfites in foods and drinks. Foods and drinks that may contain sulfites include dried fruit, potato chips, and sparkling grape juice.  Infections or inflammatory conditions such as the flu, a cold, or an inflammation of the nasal membranes (rhinitis).  Gastroesophageal reflux disease (GERD).  Exercise or strenuous activity. SYMPTOMS Symptoms may occur immediately after asthma is triggered or many hours later. Symptoms include:  Wheezing.  Excessive nighttime or early morning coughing.  Frequent or severe coughing with a common cold.  Chest tightness.  Shortness of breath. DIAGNOSIS  The diagnosis of asthma is made by a review of your medical history and  a physical exam. Tests may also be performed. These may include:  Lung function studies. These tests show how much air you breathe in and out.  Allergy tests.  Imaging tests such as X-rays. TREATMENT  Asthma cannot be cured, but it can usually be controlled. Treatment involves identifying and avoiding your asthma triggers. It also involves medicines. There are 2 classes of medicine used for asthma treatment:   Controller medicines. These prevent asthma symptoms from occurring. They are usually taken every day.  Reliever or rescue medicines. These quickly relieve asthma symptoms. They are used as needed and provide short-term relief. Your health care provider will help you create an asthma action plan. An asthma action plan is a written plan for managing and treating your asthma attacks. It includes a list of your asthma triggers and how they may be avoided. It also includes information on when medicines should be taken and when their dosage should be changed. An action plan may also involve the use of a device called a peak flow meter. A peak flow meter measures how well the lungs are working. It helps you monitor your condition. HOME CARE INSTRUCTIONS   Take medicines only as directed by your health care provider. Speak with your health care provider if you have questions about how or when to take the medicines.  Use a peak flow meter as directed by your health care provider. Record and keep track of readings.  Understand and use the action plan to help minimize or stop an asthma attack without needing to seek medical care.  Control your home environment in the following ways to help prevent asthma attacks:  Do not smoke. Avoid being exposed  to secondhand smoke.  Change your heating and air conditioning filter regularly.  Limit your use of fireplaces and wood stoves.  Get rid of pests (such as roaches and mice) and their droppings.  Throw away plants if you see mold on them.  Clean  your floors and dust regularly. Use unscented cleaning products.  Try to have someone else vacuum for you regularly. Stay out of rooms while they are being vacuumed and for a short while afterward. If you vacuum, use a dust mask from a hardware store, a double-layered or microfilter vacuum cleaner bag, or a vacuum cleaner with a HEPA filter.  Replace carpet with wood, tile, or vinyl flooring. Carpet can trap dander and dust.  Use allergy-proof pillows, mattress covers, and box spring covers.  Wash bed sheets and blankets every week in hot water and dry them in a dryer.  Use blankets that are made of polyester or cotton.  Clean bathrooms and kitchens with bleach. If possible, have someone repaint the walls in these rooms with mold-resistant paint. Keep out of the rooms that are being cleaned and painted.  Wash hands frequently. SEEK MEDICAL CARE IF:   You have wheezing, shortness of breath, or a cough even if taking medicine to prevent attacks.  The colored mucus you cough up (sputum) is thicker than usual.  Your sputum changes from clear or white to yellow, green, gray, or bloody.  You have any problems that may be related to the medicines you are taking (such as a rash, itching, swelling, or trouble breathing).  You are using a reliever medicine more than 2-3 times per week.  Your peak flow is still at 50-79% of your personal best after following your action plan for 1 hour.  You have a fever. SEEK IMMEDIATE MEDICAL CARE IF:   You seem to be getting worse and are unresponsive to treatment during an asthma attack.  You are short of breath even at rest.  You get short of breath when doing very little physical activity.  You have difficulty eating, drinking, or talking due to asthma symptoms.  You develop chest pain.  You develop a fast heartbeat.  You have a bluish color to your lips or fingernails.  You are light-headed, dizzy, or faint.  Your peak flow is less than  50% of your personal best.   This information is not intended to replace advice given to you by your health care provider. Make sure you discuss any questions you have with your health care provider.   Document Released: 06/25/2005 Document Revised: 03/16/2015 Document Reviewed: 01/22/2013 Elsevier Interactive Patient Education [employer]

## 2015-07-05 NOTE — ED Notes (Addendum)
Pt c/o SOB , seen here yesterday for same hx Asthma  Pt did not get prednisone filled

## 2015-08-25 ENCOUNTER — Ambulatory Visit (HOSPITAL_COMMUNITY): Payer: Self-pay | Admitting: Psychiatry

## 2015-10-20 ENCOUNTER — Ambulatory Visit (INDEPENDENT_AMBULATORY_CARE_PROVIDER_SITE_OTHER): Payer: BLUE CROSS/BLUE SHIELD | Admitting: Psychiatry

## 2015-10-20 ENCOUNTER — Encounter (HOSPITAL_COMMUNITY): Payer: Self-pay | Admitting: Psychiatry

## 2015-10-20 VITALS — BP 128/74 | HR 92 | Ht 63.0 in | Wt 195.0 lb

## 2015-10-20 DIAGNOSIS — G4700 Insomnia, unspecified: Secondary | ICD-10-CM

## 2015-10-20 DIAGNOSIS — F411 Generalized anxiety disorder: Secondary | ICD-10-CM

## 2015-10-20 DIAGNOSIS — F331 Major depressive disorder, recurrent, moderate: Secondary | ICD-10-CM | POA: Diagnosis not present

## 2015-10-20 MED ORDER — MIRTAZAPINE 15 MG PO TABS: 15.0000 mg | ORAL_TABLET | Freq: Every day | ORAL | Status: DC

## 2015-10-20 MED ORDER — SERTRALINE HCL 100 MG PO TABS: 200.0000 mg | ORAL_TABLET | Freq: Every day | ORAL | Status: DC

## 2015-10-20 MED ORDER — BUSPIRONE HCL 15 MG PO TABS: 15.0000 mg | ORAL_TABLET | Freq: Two times a day (BID) | ORAL | Status: DC

## 2015-10-20 NOTE — Progress Notes (Signed)
Patient ID: Sylvia Parker, female   DOB: 1986-02-27, 30 y.o.   MRN: [id number] Patient ID: Sylvia Parker, female   DOB: April 17, 1986, 30 y.o.   MRN: [id number]  [facility] [id number] Progress Note  [name] [id number] 29 y.o.  10/20/2015 10:53 AM  Chief Complaint: its getting wrose  History of Present Illness: Pt had a really bad day yesterday but has been depressed for several months. Pt states it is getting worse. Pt is calling out of work a lot. She spends her time doing nothing.    Pt feels numb and empty Pt reports hopelessness and passive thoughts of death yesterday. Today denies SI/HI/AVH. Reports pt is more emotional and very sensitive and is having frequent crying spells. Reports on going sad and anhedonia. Reports isolation, feelings of worthlessness and hopelessness.   Sleep is fair with Trazodone. She is getting about 6 hrs of broken sleep even with Trazdone. Energy is low but she is not napping.  Appetite is decreased even more and she is eating maybe once/day due to GI upset. Concentration is poor.    Anxiety is very high. She can't focus due to her anxious. Her work performance has declined significantly.   Taking meds as prescribed and endorsing SE of fatigue and random dizziness.   Pt is still trying therapy thru [facility]. States it was on the phone and it helped.   Suicidal Ideation: No Plan Formed: No Patient has means to carry out plan: No  Homicidal Ideation: No Plan Formed: No Patient has means to carry out plan: No  Review of Systems: Psychiatric: Agitation: Yes Hallucination: No Depressed Mood: Yes Insomnia: Yes Hypersomnia: No Altered Concentration: Yes Feels Worthless: Yes Grandiose Ideas: No Belief In Special Powers: No New/Increased Substance Abuse: Yes smoking THC multiple times a day Compulsions: No  Neurologic: Headache: Yes Seizure: No Paresthesias: No   Review of Systems  Constitutional: Positive for  malaise/fatigue. Negative for fever and chills.  HENT: Negative for congestion, ear pain, hearing loss, nosebleeds and sore throat.   Eyes: Negative for blurred vision, double vision, photophobia and redness.  Respiratory: Negative for cough, shortness of breath and wheezing.   Cardiovascular: Negative for chest pain, palpitations and leg swelling.  Gastrointestinal: Positive for heartburn, abdominal pain and diarrhea. Negative for nausea and vomiting.  Musculoskeletal: Negative for back pain, joint pain and neck pain.  Skin: Negative for itching and rash.  Neurological: Positive for dizziness and headaches. Negative for tremors, sensory change, seizures and loss of consciousness.  Psychiatric/Behavioral: Positive for depression and substance abuse. Negative for suicidal ideas and hallucinations. The patient is nervous/anxious and has insomnia.      Past Medical Family, Social History: living with wife. No kids. Working at [employer] for last 4 yrs. raised by mom and dad separately. Parents divorced when she was 7. Has 2 brother and 2 sisters. Pt is in the middle. States it was hard b/c her dad wasn't there and mom was depressed.  reports that she has never smoked. She has never used smokeless tobacco. She reports that she drinks alcohol. She reports that she uses illicit drugs (Marijuana).  Family History  Problem Relation Age of Onset  . Kidney failure Father   . Cancer Maternal Grandfather   . Cancer Paternal Grandfather   . Depression Mother   . Suicidality Neg Hx   . Seizures Neg Hx   . Schizophrenia Neg Hx   . Anxiety disorder Neg Hx  Past Medical History  Diagnosis Date  . Allergy   . Asthma   . Depression    Outpatient Encounter Prescriptions as of 10/20/2015  Medication Sig  . albuterol (PROVENTIL HFA;VENTOLIN HFA) 108 (90 Base) MCG/ACT inhaler Inhale 1-2 puffs into the lungs every 6 (six) hours as needed for wheezing or shortness of breath.  Marland Kitchen buPROPion  (WELLBUTRIN SR) 150 MG 12 hr tablet Take 1 tablet (150 mg total) by mouth 2 (two) times daily.  . busPIRone (BUSPAR) 15 MG tablet Take 1 tablet (15 mg total) by mouth 2 (two) times daily.  . pregabalin (LYRICA) 150 MG capsule Take 150 mg by mouth 2 (two) times daily.  . sertraline (ZOLOFT) 100 MG tablet Take 2 tablets (200 mg total) by mouth daily.  . traZODone (DESYREL) 50 MG tablet Take 1-2 tabs po qHS prn insomnia  . HYDROcodone-acetaminophen (NORCO/VICODIN) 5-325 MG per tablet Take 1-2 tablets by mouth every 6 hours as needed for pain and/or cough. (Patient not taking: Reported on 06/21/2015)  . methocarbamol (ROBAXIN) 500 MG tablet Take 2 tablets (1,000 mg total) by mouth 4 (four) times daily as needed (Pain). (Patient not taking: Reported on 10/20/2015)  . naproxen (NAPROSYN) 500 MG tablet Take 1 tablet (500 mg total) by mouth 2 (two) times daily. (Patient not taking: Reported on 10/20/2015)  . oxyCODONE-acetaminophen (PERCOCET/ROXICET) 5-325 MG per tablet Take 2 tablets by mouth every 4 (four) hours as needed for severe pain. (Patient not taking: Reported on 06/21/2015)  . predniSONE (DELTASONE) 10 MG tablet Take 2 tablets (20 mg total) by mouth 2 (two) times daily. (Patient not taking: Reported on 10/20/2015)   No facility-administered encounter medications on file as of 10/20/2015.    Past Psychiatric History/Hospitalization(s): Anxiety: No Bipolar Disorder: No Depression: No Mania: No Psychosis: No Schizophrenia: No Personality Disorder: No Hospitalization for psychiatric illness: No History of Electroconvulsive Shock Therapy: No Prior Suicide Attempts: Yes  Physical Exam: Constitutional:  BP 128/74 mmHg  Pulse 92  Ht  (1.6 m)  Wt 195 lb (88.451 kg)  BMI 34.55 kg/m2  General Appearance: alert, oriented, no acute distress  Musculoskeletal: Strength & Muscle Tone: within normal limits Gait & Station: normal Patient leans: straight  Mental Status  Examination/Evaluation: Objective: Attitude: Calm and cooperative  Appearance: Fairly Groomed and Neat, appears to be stated age  Eye Contact::  Good  Speech:  Clear and Coherent and Normal Rate  Volume:  Normal  Mood: anxious and depressed  Affect:  Congruent, Depressed and Tearful  Thought Process:  Goal Directed, Linear and Logical  Orientation:  Full (Time, Place, and Person)  Thought Content:  Negative  Suicidal Thoughts:  No  Homicidal Thoughts:  No  Judgement:  Good  Insight:  Good  Concentration: good  Memory: Immediate-good Recent-good Remote-good  Recall: fair  Language: fair  Gait and Station: normal  [employer] of Knowledge: average  Psychomotor Activity:  Normal  Akathisia:  No  Handed:  Right  AIMS (if indicated): n/a  Assets:  Communication Skills Desire for Improvement Housing Intimacy Social Support Location manager (Choose Three): Review of Psycho-Social Stressors (1), Review or order clinical lab tests (1), Established Problem, Worsening (2), Review of Medication Regimen & Side Effects (2) and Review of New Medication or Change in Dosage (2)  Assessment: AXIS I Generalized Anxiety Disorder and Major Depression, Recurrent moderate; Insomnia  AXIS II Deferred   Treatment Plan/Recommendations:  Plan of Care: Medication management  with supportive therapy. Risks/benefits and SE of the medication discussed. Pt verbalized understanding and verbal consent obtained for treatment. Affirm with the patient that the medications are taken as ordered. Patient expressed understanding of how their medications were to be used.    Laboratory: 07/05/2015  CBC- HB 11.1 Chemistry Profile  K 3.3  HbAIC UDS Vitamin B-12 TSH  Psychotherapy: Therapy: brief supportive therapy provided. Discussed psychosocial stressors in detail.    Medications: Zoloft 200mg  po qD for depression and  anxiety Buspar 15 mg po BID for anxiety D/c Trazodone  Start trial of Remeron 15mg  po qHS for mood, anxiety and sleep D/c Wellbutrin   Routine PRN Medications: Yes  Consultations: encouraged to restart individual therapy    Safety Concerns: Pt denies SI and is at an acute low risk for suicide.Patient told to call clinic if any problems occur. Patient advised to go to ER if they should develop SI/HI, side effects, or if symptoms worsen. Has crisis numbers to call if needed. Pt verbalized understanding.   Other: F/up in 2 months or sooner if needed.    Oletta DarterSalina Citlali Gautney, MD 10/20/2015

## 2015-12-20 ENCOUNTER — Ambulatory Visit (HOSPITAL_COMMUNITY): Payer: Self-pay | Admitting: Psychiatry

## 2016-03-01 ENCOUNTER — Telehealth (HOSPITAL_COMMUNITY): Payer: Self-pay

## 2016-03-01 NOTE — Telephone Encounter (Signed)
Called phone number listed on chart. No answer and unable to leave voice message

## 2016-03-29 ENCOUNTER — Emergency Department (HOSPITAL_BASED_OUTPATIENT_CLINIC_OR_DEPARTMENT_OTHER): Payer: BLUE CROSS/BLUE SHIELD

## 2016-03-29 ENCOUNTER — Encounter (HOSPITAL_BASED_OUTPATIENT_CLINIC_OR_DEPARTMENT_OTHER): Payer: Self-pay | Admitting: Emergency Medicine

## 2016-03-29 ENCOUNTER — Telehealth: Payer: Self-pay | Admitting: Emergency Medicine

## 2016-03-29 ENCOUNTER — Inpatient Hospital Stay (HOSPITAL_BASED_OUTPATIENT_CLINIC_OR_DEPARTMENT_OTHER)
Admission: EM | Admit: 2016-03-29 | Discharge: 2016-03-31 | DRG: 189 | Disposition: A | Discharge status: Home / Self Care | Payer: BLUE CROSS/BLUE SHIELD | Attending: Internal Medicine | Admitting: Internal Medicine

## 2016-03-29 DIAGNOSIS — E872 Acidosis: Secondary | ICD-10-CM | POA: Diagnosis present

## 2016-03-29 DIAGNOSIS — Z6837 Body mass index (BMI) 37.0-37.9, adult: Secondary | ICD-10-CM | POA: Diagnosis not present

## 2016-03-29 DIAGNOSIS — F331 Major depressive disorder, recurrent, moderate: Secondary | ICD-10-CM | POA: Diagnosis not present

## 2016-03-29 DIAGNOSIS — I10 Essential (primary) hypertension: Secondary | ICD-10-CM | POA: Diagnosis present

## 2016-03-29 DIAGNOSIS — R0600 Dyspnea, unspecified: Secondary | ICD-10-CM | POA: Diagnosis not present

## 2016-03-29 DIAGNOSIS — J9601 Acute respiratory failure with hypoxia: Secondary | ICD-10-CM | POA: Diagnosis not present

## 2016-03-29 DIAGNOSIS — D509 Iron deficiency anemia, unspecified: Secondary | ICD-10-CM | POA: Diagnosis present

## 2016-03-29 DIAGNOSIS — J45901 Unspecified asthma with (acute) exacerbation: Secondary | ICD-10-CM | POA: Diagnosis present

## 2016-03-29 DIAGNOSIS — F339 Major depressive disorder, recurrent, unspecified: Secondary | ICD-10-CM | POA: Diagnosis present

## 2016-03-29 DIAGNOSIS — Z23 Encounter for immunization: Secondary | ICD-10-CM | POA: Diagnosis not present

## 2016-03-29 DIAGNOSIS — Z79899 Other long term (current) drug therapy: Secondary | ICD-10-CM

## 2016-03-29 DIAGNOSIS — E669 Obesity, unspecified: Secondary | ICD-10-CM | POA: Diagnosis present

## 2016-03-29 DIAGNOSIS — J45909 Unspecified asthma, uncomplicated: Secondary | ICD-10-CM | POA: Insufficient documentation

## 2016-03-29 DIAGNOSIS — F411 Generalized anxiety disorder: Secondary | ICD-10-CM | POA: Diagnosis present

## 2016-03-29 LAB — CBC WITH DIFFERENTIAL/PLATELET
BASOS PCT: 0 %
Basophils Absolute: 0 10*3/uL (ref 0.0–0.1)
EOS ABS: 0.3 10*3/uL (ref 0.0–0.7)
Eosinophils Relative: 2 %
HCT: 31.8 % — ABNORMAL LOW (ref 36.0–46.0)
HEMOGLOBIN: 10.3 g/dL — AB (ref 12.0–15.0)
Lymphocytes Relative: 5 %
Lymphs Abs: 0.7 10*3/uL (ref 0.7–4.0)
MCH: 24.3 pg — ABNORMAL LOW (ref 26.0–34.0)
MCHC: 32.4 g/dL (ref 30.0–36.0)
MCV: 75.2 fL — ABNORMAL LOW (ref 78.0–100.0)
Monocytes Absolute: 0.5 10*3/uL (ref 0.1–1.0)
Monocytes Relative: 4 %
NEUTROS PCT: 89 %
Neutro Abs: 10.8 10*3/uL — ABNORMAL HIGH (ref 1.7–7.7)
PLATELETS: 341 10*3/uL (ref 150–400)
RBC: 4.23 MIL/uL (ref 3.87–5.11)
RDW: 17.8 % — ABNORMAL HIGH (ref 11.5–15.5)
WBC: 12.2 10*3/uL — AB (ref 4.0–10.5)

## 2016-03-29 LAB — MRSA PCR SCREENING: MRSA by PCR: NEGATIVE

## 2016-03-29 LAB — PREGNANCY, URINE: PREG TEST UR: NEGATIVE

## 2016-03-29 LAB — LACTIC ACID, PLASMA: LACTIC ACID, VENOUS: 2.9 mmol/L — AB (ref 0.5–1.9)

## 2016-03-29 LAB — BASIC METABOLIC PANEL
Anion gap: 9 (ref 5–15)
BUN: 8 mg/dL (ref 6–20)
CALCIUM: 8.8 mg/dL — AB (ref 8.9–10.3)
CO2: 22 mmol/L (ref 22–32)
CREATININE: 0.92 mg/dL (ref 0.44–1.00)
Chloride: 105 mmol/L (ref 101–111)
GFR calc non Af Amer: >60 mL/min (ref >60)
Glucose, Bld: 99 mg/dL (ref 65–99)
Potassium: 3.7 mmol/L (ref 3.5–5.1)
SODIUM: 136 mmol/L (ref 135–145)

## 2016-03-29 LAB — TROPONIN I

## 2016-03-29 MED ORDER — SERTRALINE HCL 50 MG PO TABS
200.0000 mg | ORAL_TABLET | ORAL | Status: DC
  Administered 2016-03-30 – 2016-03-31 (×2): 200 mg via ORAL
  Filled 2016-03-29: qty 2
  Filled 2016-03-29: qty 4

## 2016-03-29 MED ORDER — IPRATROPIUM BROMIDE 0.02 % IN SOLN
0.5000 mg | RESPIRATORY_TRACT | Status: DC
  Filled 2016-03-29: qty 2.5

## 2016-03-29 MED ORDER — LEVALBUTEROL HCL 1.25 MG/0.5ML IN NEBU
INHALATION_SOLUTION | RESPIRATORY_TRACT | Status: AC
  Filled 2016-03-29: qty 1

## 2016-03-29 MED ORDER — METHOCARBAMOL 500 MG PO TABS: 1000.0000 mg | ORAL_TABLET | Freq: Four times a day (QID) | ORAL | Status: DC | PRN

## 2016-03-29 MED ORDER — SODIUM CHLORIDE 0.9 % IV SOLN
INTRAVENOUS | Status: DC
  Administered 2016-03-30 (×3): via INTRAVENOUS

## 2016-03-29 MED ORDER — PNEUMOCOCCAL VAC POLYVALENT 25 MCG/0.5ML IJ INJ
0.5000 mL | INJECTION | INTRAMUSCULAR | Status: AC
  Administered 2016-03-30: 0.5 mL via INTRAMUSCULAR
  Filled 2016-03-29: qty 0.5

## 2016-03-29 MED ORDER — LORAZEPAM 2 MG/ML IJ SOLN: 0.5000 mg | Freq: Four times a day (QID) | INTRAMUSCULAR | Status: DC | PRN

## 2016-03-29 MED ORDER — LEVALBUTEROL HCL 0.63 MG/3ML IN NEBU
INHALATION_SOLUTION | RESPIRATORY_TRACT | Status: AC
  Administered 2016-03-29: 0.63 mg via RESPIRATORY_TRACT
  Filled 2016-03-29: qty 3

## 2016-03-29 MED ORDER — MIRTAZAPINE 15 MG PO TABS: 15.0000 mg | ORAL_TABLET | Freq: Every evening | ORAL | Status: DC | PRN

## 2016-03-29 MED ORDER — SODIUM CHLORIDE 0.9 % IV BOLUS (SEPSIS)
1000.0000 mL | Freq: Once | INTRAVENOUS | Status: AC
  Administered 2016-03-29: 1000 mL via INTRAVENOUS

## 2016-03-29 MED ORDER — IPRATROPIUM BROMIDE 0.02 % IN SOLN
RESPIRATORY_TRACT | Status: AC
  Administered 2016-03-29: 0.5 mg
  Filled 2016-03-29: qty 2.5

## 2016-03-29 MED ORDER — SODIUM CHLORIDE 0.9 % IV BOLUS (SEPSIS)
500.0000 mL | Freq: Once | INTRAVENOUS | Status: AC
  Administered 2016-03-29: 500 mL via INTRAVENOUS

## 2016-03-29 MED ORDER — MAGNESIUM SULFATE 2 GM/50ML IV SOLN
2.0000 g | Freq: Once | INTRAVENOUS | Status: AC
  Administered 2016-03-29: 2 g via INTRAVENOUS
  Filled 2016-03-29: qty 50

## 2016-03-29 MED ORDER — ACETAMINOPHEN 325 MG PO TABS: 650.0000 mg | ORAL_TABLET | Freq: Four times a day (QID) | ORAL | Status: DC | PRN

## 2016-03-29 MED ORDER — IPRATROPIUM BROMIDE 0.02 % IN SOLN
0.5000 mg | RESPIRATORY_TRACT | Status: DC
  Administered 2016-03-29 – 2016-03-30 (×6): 0.5 mg via RESPIRATORY_TRACT
  Filled 2016-03-29 (×5): qty 2.5

## 2016-03-29 MED ORDER — LEVALBUTEROL HCL 1.25 MG/0.5ML IN NEBU
2.5000 mg | INHALATION_SOLUTION | Freq: Once | RESPIRATORY_TRACT | Status: AC
  Administered 2016-03-29: 2.5 mg via RESPIRATORY_TRACT

## 2016-03-29 MED ORDER — LEVALBUTEROL HCL 0.63 MG/3ML IN NEBU
0.6300 mg | INHALATION_SOLUTION | RESPIRATORY_TRACT | Status: DC
  Administered 2016-03-29 – 2016-03-30 (×6): 0.63 mg via RESPIRATORY_TRACT
  Filled 2016-03-29 (×6): qty 3

## 2016-03-29 MED ORDER — DM-GUAIFENESIN ER 30-600 MG PO TB12
1.0000 | ORAL_TABLET | Freq: Two times a day (BID) | ORAL | Status: DC
  Administered 2016-03-29 – 2016-03-31 (×4): 1 via ORAL
  Filled 2016-03-29 (×4): qty 1

## 2016-03-29 MED ORDER — ZOLPIDEM TARTRATE 5 MG PO TABS: 5.0000 mg | ORAL_TABLET | Freq: Every evening | ORAL | Status: DC | PRN

## 2016-03-29 MED ORDER — ALBUTEROL SULFATE (2.5 MG/3ML) 0.083% IN NEBU
5.0000 mg | INHALATION_SOLUTION | Freq: Once | RESPIRATORY_TRACT | Status: AC
  Administered 2016-03-29: 5 mg via RESPIRATORY_TRACT
  Filled 2016-03-29: qty 6

## 2016-03-29 MED ORDER — IPRATROPIUM-ALBUTEROL 0.5-2.5 (3) MG/3ML IN SOLN
3.0000 mL | RESPIRATORY_TRACT | Status: DC
  Administered 2016-03-29: 3 mL via RESPIRATORY_TRACT
  Filled 2016-03-29: qty 3

## 2016-03-29 MED ORDER — METHYLPREDNISOLONE SODIUM SUCC 125 MG IJ SOLR
60.0000 mg | Freq: Three times a day (TID) | INTRAMUSCULAR | Status: DC
  Administered 2016-03-29 – 2016-03-31 (×5): 60 mg via INTRAVENOUS
  Filled 2016-03-29 (×5): qty 2

## 2016-03-29 MED ORDER — ENOXAPARIN SODIUM 40 MG/0.4ML ~~LOC~~ SOLN
40.0000 mg | Freq: Every day | SUBCUTANEOUS | Status: DC
  Administered 2016-03-29 – 2016-03-30 (×2): 40 mg via SUBCUTANEOUS
  Filled 2016-03-29 (×2): qty 0.4

## 2016-03-29 MED ORDER — PREGABALIN 75 MG PO CAPS
150.0000 mg | ORAL_CAPSULE | Freq: Two times a day (BID) | ORAL | Status: DC
  Administered 2016-03-29 – 2016-03-31 (×4): 150 mg via ORAL
  Filled 2016-03-29 (×4): qty 2

## 2016-03-29 MED ORDER — LEVALBUTEROL HCL 1.25 MG/0.5ML IN NEBU: 2.5000 mg | INHALATION_SOLUTION | Freq: Four times a day (QID) | RESPIRATORY_TRACT | Status: DC | PRN

## 2016-03-29 MED ORDER — ALBUTEROL (5 MG/ML) CONTINUOUS INHALATION SOLN
INHALATION_SOLUTION | RESPIRATORY_TRACT | Status: AC
  Administered 2016-03-29: 3 mg/h
  Filled 2016-03-29: qty 20

## 2016-03-29 MED ORDER — HYDROCODONE-ACETAMINOPHEN 5-325 MG PO TABS
1.0000 | ORAL_TABLET | Freq: Four times a day (QID) | ORAL | Status: DC | PRN
  Filled 2016-03-29: qty 1

## 2016-03-29 MED ORDER — METHYLPREDNISOLONE SODIUM SUCC 125 MG IJ SOLR
125.0000 mg | Freq: Once | INTRAMUSCULAR | Status: AC
  Administered 2016-03-29: 125 mg via INTRAVENOUS
  Filled 2016-03-29: qty 2

## 2016-03-29 MED ORDER — SODIUM CHLORIDE 0.9 % IV BOLUS (SEPSIS)
500.0000 mL | Freq: Once | INTRAVENOUS | Status: AC
  Administered 2016-03-30: 500 mL via INTRAVENOUS

## 2016-03-29 NOTE — ED Triage Notes (Signed)
Pt with asthma exacerbation that has been persistent since last night. States she has used her neb multiple times with no relief. Able to speak but is visibly labored. RT notified.

## 2016-03-29 NOTE — ED Notes (Signed)
Dr Rubin PayorPickering in to see pt. Pt remains short of breath and is on O2 at 2liters/m. To keep sats >93%.

## 2016-03-29 NOTE — ED Notes (Signed)
[facility]

## 2016-03-29 NOTE — ED Notes (Signed)
Pt care turned over to [facility] transport for transport to [facility]. Pt was stable at time of transport.

## 2016-03-29 NOTE — ED Notes (Signed)
Patient getting Xray at this time.

## 2016-03-29 NOTE — ED Provider Notes (Signed)
  Physical Exam  BP 126/60   Pulse (!) 130   Temp 98.3 F (36.8 C) (Oral)   Resp (!) 30   Ht 5\' 1"  (1.549 m)   Wt 200 lb (90.7 kg)   LMP 03/26/2016   SpO2 92%   BMI 37.79 kg/m   Physical Exam  ED Course  Procedures  MDM Patient's had continued dyspnea. Still wheezing. Still tachypnea. Requiring oxygen. Discussed with Dr. Mal MistyNetty and patient will be admitted to a step down bed instead of the telemetry bed.       Sylvia CoreNathan Mariam Helbert, MD 03/29/16 [phone]

## 2016-03-29 NOTE — Progress Notes (Signed)
[facility]  Sylvia Parker is a 30 y.o. female with a history of asthma, generalized anxiety disorder, major depression at present it to the emergency department with dyspnea and found to have an asthma exacerbation. She initially had some moderate distress with accessory muscle usage. She was given continuous albuterol in addition to ipratropium, magnesium and Solu-Medrol. Distress has improved the patient still has significant wheezing on exam. Patient is being transferred for continued management of asthma exacerbation. As a previous ICU admission as a child and no history of intubation.  Jacquelin Hawkingalph Glendell Fouse, MD [facility] 03/29/2016, 5:25 PM

## 2016-03-29 NOTE — H&P (Signed)
History and Physical    Sylvia Parker ZOX:[id number] DOB: April 02, 1986 DOA: 03/29/2016  Referring MD/NP/PA:   PCP: Gus Rankin, PA-C   Patient coming from:  The patient is coming from home.  At baseline, pt is independent for most of ADL.   Chief Complaint: Shortness stress, dry cough  HPI: Sylvia Parker is a 30 y.o. female with medical history significant of asthma, depression, anxiety, allergy, who presents with shortness present injury:  Patient states that she has been having SOB and dry cough for 3 days,  which has been progressively getting worse. She can speak in full sentences currently. She does not have fever or chills. No runny nose or throat. She has mild chest pain which is introduced by coughing. No tenderness over calf areas. She has nausea, and vomited twice due to severe coughing. No abdominal pain or diarrhea. Denies symptoms of UTI or unilateral weakness. no history of intubation.  ED Course: pt was found to have WBC 12.2, negative troponin, negative pregnancy test, electrolytes renal function okay, temperature 99.2, tachycardia, tachypnea, chest x-ray with bronchitis change without infiltration. Patient is admitted to stepdown bed for as inpatient.   Review of Systems:   General: no fevers, chills, no changes in body weight, has poor appetite, has fatigue HEENT: no blurry vision, hearing changes or sore throat Respiratory: has dyspnea, coughing, wheezing CV: has chest pain, no palpitations GI: has nausea, vomiting, no abdominal pain, diarrhea, constipation GU: no dysuria, burning on urination, increased urinary frequency, hematuria  Ext: no leg edema Neuro: no unilateral weakness, numbness, or tingling, no vision change or hearing loss Skin: no rash, no skin tear. MSK: No muscle spasm, no deformity, no limitation of range of movement in spin Heme: No easy bruising.  Travel history: No recent long distant travel.  Allergy: No Known  Allergies  Past Medical History:  Diagnosis Date  . Allergy   . Asthma   . Depression     Past Surgical History:  Procedure Laterality Date  . BREAST SURGERY      Social History:  reports that she has never smoked. She has never used smokeless tobacco. She reports that she drinks alcohol. She reports that she uses drugs, including Marijuana.  Family History:  Family History  Problem Relation Age of Onset  . Kidney failure Father   . Cancer Maternal Grandfather   . Cancer Paternal Grandfather   . Depression Mother   . Suicidality Neg Hx   . Seizures Neg Hx   . Schizophrenia Neg Hx   . Anxiety disorder Neg Hx      Prior to Admission medications   Medication Sig Start Date End Date Taking? Authorizing Provider  albuterol (PROVENTIL HFA;VENTOLIN HFA) 108 (90 Base) MCG/ACT inhaler Inhale 1-2 puffs into the lungs every 6 (six) hours as needed for wheezing or shortness of breath. 07/05/15  Yes Cheri Fowler, PA-C  methocarbamol (ROBAXIN) 500 MG tablet Take 2 tablets (1,000 mg total) by mouth 4 (four) times daily as needed (Pain). 02/13/15  Yes Nicole Pisciotta, PA-C  mirtazapine (REMERON) 15 MG tablet Take 1 tablet (15 mg total) by mouth at bedtime. Patient taking differently: Take 15 mg by mouth at bedtime as needed (sleep).  10/20/15 10/19/16 Yes Oletta Darter, MD  pregabalin (LYRICA) 150 MG capsule Take 150 mg by mouth 2 (two) times daily.   Yes Historical Provider, MD  sertraline (ZOLOFT) 100 MG tablet Take 2 tablets (200 mg total) by mouth daily. Patient taking differently: Take 200  mg by mouth every morning.  10/20/15  Yes Oletta DarterSalina Agarwal, MD  busPIRone (BUSPAR) 15 MG tablet Take 1 tablet (15 mg total) by mouth 2 (two) times daily. Patient not taking: Reported on 03/29/2016 10/20/15   Oletta DarterSalina Agarwal, MD  HYDROcodone-acetaminophen (NORCO/VICODIN) 5-325 MG per tablet Take 1-2 tablets by mouth every 6 hours as needed for pain and/or cough. Patient not taking: Reported on 03/29/2016 02/13/15    Wynetta EmeryNicole Pisciotta, PA-C    Physical Exam: Vitals:   03/29/16 1918 03/29/16 2036 03/29/16 2100 03/29/16 2200  BP: (!) 140/55  128/75 140/64  Pulse:    (!) 135  Resp: (!) 23  (!) 28 (!) 28  Temp: 99.2 F (37.3 C)     TempSrc: Oral     SpO2:  100%  92%  Weight: 90.3 kg (199 lb 1.2 oz)     Height: 5\' 1"  (1.549 m)      General: Not in acute distress HEENT:       Eyes: PERRL, EOMI, no scleral icterus.       ENT: No discharge from the ears and nose, no pharynx injection, no tonsillar enlargement.        Neck: No JVD, no bruit, no mass felt. Heme: No neck lymph node enlargement. Cardiac: S1/S2, RRR, No murmurs, No gallops or rubs. Respiratory: Mildly decreased air movement bilaterally. Has wheezing bilaterally. No rales or rubs. GI: Soft, nondistended, nontender, no rebound pain, no organomegaly, BS present. GU: No hematuria Ext: No pitting leg edema bilaterally. 2+DP/PT pulse bilaterally. Musculoskeletal: No joint deformities, No joint redness or warmth, no limitation of ROM in spin. Skin: No rashes.  Neuro: Alert, oriented X3, cranial nerves II-XII grossly intact, moves all extremities normally.  Psych: Patient is not psychotic, no suicidal or hemocidal ideation.  Labs on Admission: I have personally reviewed following labs and imaging studies  CBC:  Recent Labs Lab 03/29/16 1420  WBC 12.2*  NEUTROABS 10.8*  HGB 10.3*  HCT 31.8*  MCV 75.2*  PLT 341   Basic Metabolic Panel:  Recent Labs Lab 03/29/16 1420  NA 136  K 3.7  CL 105  CO2 22  GLUCOSE 99  BUN 8  CREATININE 0.92  CALCIUM 8.8*   GFR: Estimated Creatinine Clearance: 91.5 mL/min (by C-G formula based on SCr of 0.92 mg/dL). Liver Function Tests: No results for input(s): AST, ALT, ALKPHOS, BILITOT, PROT, ALBUMIN in the last 168 hours. No results for input(s): LIPASE, AMYLASE in the last 168 hours. No results for input(s): AMMONIA in the last 168 hours. Coagulation Profile: No results for input(s): INR,  PROTIME in the last 168 hours. Cardiac Enzymes:  Recent Labs Lab 03/29/16 1420  TROPONINI <0.03   BNP (last 3 results) No results for input(s): PROBNP in the last 8760 hours. HbA1C: No results for input(s): HGBA1C in the last 72 hours. CBG: No results for input(s): GLUCAP in the last 168 hours. Lipid Profile: No results for input(s): CHOL, HDL, LDLCALC, TRIG, CHOLHDL, LDLDIRECT in the last 72 hours. Thyroid Function Tests: No results for input(s): TSH, T4TOTAL, FREET4, T3FREE, THYROIDAB in the last 72 hours. Anemia Panel: No results for input(s): VITAMINB12, FOLATE, FERRITIN, TIBC, IRON, RETICCTPCT in the last 72 hours. Urine analysis:    Component Value Date/Time   BILIRUBINUR neg 12/07/2012 1113   PROTEINUR neg 12/07/2012 1113   UROBILINOGEN 0.2 12/07/2012 1113   NITRITE neg 12/07/2012 1113   LEUKOCYTESUR Trace 12/07/2012 1113   Sepsis Labs: @LABRCNTIP (procalcitonin:4,lacticidven:4) ) Recent Results (from the past 240 hour(s))  MRSA PCR Screening     Status: None   Collection Time: 03/29/16  7:22 PM  Result Value Ref Range Status   MRSA by PCR NEGATIVE NEGATIVE Final    Comment:        The GeneXpert MRSA Assay (FDA approved for NASAL specimens only), is one component of a comprehensive MRSA colonization surveillance program. It is not intended to diagnose MRSA infection nor to guide or monitor treatment for MRSA infections.      Radiological Exams on Admission: Dg Chest Port 1 View  Result Date: 03/29/2016 CLINICAL DATA:  Asthma exacerbation beginning last night. EXAM: PORTABLE CHEST 1 VIEW COMPARISON:  PA and lateral chest 07/05/2015. FINDINGS: There is peribronchial thickening. Lung volumes are unremarkable. No consolidative process, pneumothorax or effusion. Heart size is normal. No focal bony abnormality. IMPRESSION: Bronchitic change without focal process. Electronically Signed   By: Drusilla Kanner M.D.   On: 03/29/2016 14:27     EKG: Independently  reviewed.  Sinus rhythm, QTC 417, tachycardia, early R-wave progression   Assessment/Plan Principal Problem:   Acute respiratory failure with hypoxia (HCC) Active Problems:   MDD (major depressive disorder), recurrent episode, moderate (HCC)   GAD (generalized anxiety disorder)   Asthma exacerbation   Acute respiratory failure with hypoxia due to Asthma exacerbation: no infiltration on chest x-ray. Patient does not have productive cough, no fever. No indication for antibiotics now. Pt received 1 dose of Solu-Medrol and 2 g of magnesium sulfate in ED, she feels better now.  -will admit to SDU as inpt -Nebulizers: Scheduled Atrovent and prn Xopenex nebs -Solu-Medrol 60 mg IV q8h  -Mucinex for cough  -Follow up sputum culture, respiratory virus panel -check lactic acid level -IVF: 2.5L of NS bolus, followed by 100 cc/h   Depression and anxiety: Stable, no suicidal or homicidal ideations. -Continue home medications: Mirtazapine, Zoloft - When necessary Ativan   DVT ppx: SQ Lovenox Code Status: Full code Family Communication:  Yes, patient's mother at bed side Disposition Plan:  Anticipate discharge back to previous home environment Consults called:  none Admission status: SDU/inpation       Date of Service 03/29/2016    Lorretta Harp [facility] Pager [phone]  If 7PM-7AM, please contact night-coverage [email or web address] Password [facility] 03/29/2016, 10:33 PM

## 2016-03-29 NOTE — ED Notes (Signed)
Called hospitalist for Dr. Rubin [facility]Pickering via [facility]

## 2016-03-29 NOTE — ED Notes (Signed)
[facility]

## 2016-03-29 NOTE — ED Provider Notes (Signed)
[facility] Provider Note   CSN: [id number] Arrival date & time: 03/29/16  1324     History   Chief Complaint Chief Complaint  Patient presents with  . Asthma    HPI Sylvia Parker is a 30 y.o. female.  The history is provided by the patient. No language interpreter was used.  Asthma    Sylvia Parker is a 30 y.o. female who presents to the Emergency Department complaining of sob.  She has a history of asthma and over the last 4 days she's had increased cough and congestion with a sick contact. Over the last week she's been using her inhaler more. Last night her symptoms became significantly worse with increased shortness of breath, chest pain and tightness and cough. Cough is nonproductive. No vomiting, leg swelling or pain. She has tried her albuterol neb treatments without any improvement in her symptoms. She has a history of prior hospitalization for her asthma.  She had a temp of 100.5.    Past Medical History:  Diagnosis Date  . Allergy   . Asthma   . Depression     Patient Active Problem List   Diagnosis Date Noted  . Asthma exacerbation 03/29/2016  . Insomnia 06/21/2015  . MDD (major depressive disorder), recurrent episode, moderate (HCC) 04/20/2014  . GAD (generalized anxiety disorder) 04/20/2014  . Insomnia secondary to depression with anxiety 04/20/2014    Past Surgical History:  Procedure Laterality Date  . BREAST SURGERY      OB History    No data available       Home Medications    Prior to Admission medications   Medication Sig Start Date End Date Taking? Authorizing Provider  albuterol (PROVENTIL HFA;VENTOLIN HFA) 108 (90 Base) MCG/ACT inhaler Inhale 1-2 puffs into the lungs every 6 (six) hours as needed for wheezing or shortness of breath. 07/05/15   Cheri FowlerKayla Rose, PA-C  busPIRone (BUSPAR) 15 MG tablet Take 1 tablet (15 mg total) by mouth 2 (two) times daily. 10/20/15   Oletta DarterSalina Agarwal, MD  HYDROcodone-acetaminophen  (NORCO/VICODIN) 5-325 MG per tablet Take 1-2 tablets by mouth every 6 hours as needed for pain and/or cough. Patient not taking: Reported on 06/21/2015 02/13/15   Joni ReiningNicole Pisciotta, PA-C  methocarbamol (ROBAXIN) 500 MG tablet Take 2 tablets (1,000 mg total) by mouth 4 (four) times daily as needed (Pain). Patient not taking: Reported on 10/20/2015 02/13/15   Joni ReiningNicole Pisciotta, PA-C  mirtazapine (REMERON) 15 MG tablet Take 1 tablet (15 mg total) by mouth at bedtime. 10/20/15 10/19/16  Oletta DarterSalina Agarwal, MD  naproxen (NAPROSYN) 500 MG tablet Take 1 tablet (500 mg total) by mouth 2 (two) times daily. Patient not taking: Reported on 10/20/2015 12/14/14   Catha GosselinHanna Patel-Mills, PA-C  oxyCODONE-acetaminophen (PERCOCET/ROXICET) 5-325 MG per tablet Take 2 tablets by mouth every 4 (four) hours as needed for severe pain. Patient not taking: Reported on 06/21/2015 12/14/14   Catha GosselinHanna Patel-Mills, PA-C  predniSONE (DELTASONE) 10 MG tablet Take 2 tablets (20 mg total) by mouth 2 (two) times daily. Patient not taking: Reported on 10/20/2015 07/05/15   Geoffery Lyonsouglas Delo, MD  pregabalin (LYRICA) 150 MG capsule Take 150 mg by mouth 2 (two) times daily.    Historical Provider, MD  sertraline (ZOLOFT) 100 MG tablet Take 2 tablets (200 mg total) by mouth daily. 10/20/15   Oletta DarterSalina Agarwal, MD    Family History Family History  Problem Relation Age of Onset  . Kidney failure Father   . Cancer Maternal Grandfather   .  Cancer Paternal Grandfather   . Depression Mother   . Suicidality Neg Hx   . Seizures Neg Hx   . Schizophrenia Neg Hx   . Anxiety disorder Neg Hx     Social History Social History  Substance Use Topics  . Smoking status: Never Smoker  . Smokeless tobacco: Never Used  . Alcohol use Yes     Comment: occ- 2x/month 1-2 drinks per episode     Allergies   Review of patient's allergies indicates no known allergies.   Review of Systems Review of Systems  All other systems reviewed and are negative.    Physical  Exam Updated Vital Signs BP 126/60   Pulse (!) 130   Temp 98.3 F (36.8 C) (Oral)   Resp (!) 30   Ht 5\' 1"  (1.549 m)   Wt 200 lb (90.7 kg)   LMP 03/26/2016   SpO2 92%   BMI 37.79 kg/m   Physical Exam  Constitutional: She is oriented to person, place, and time. She appears well-developed and well-nourished.  HENT:  Head: Normocephalic and atraumatic.  Cardiovascular: Normal rate and regular rhythm.   No murmur heard. Pulmonary/Chest: She is in respiratory distress.  Tachypnea with accessory muscle use. Decreased air movement bilaterally with end expiratory wheezes diffusely  Abdominal: Soft. There is no tenderness. There is no rebound and no guarding.  Musculoskeletal: She exhibits no edema or tenderness.  Neurological: She is alert and oriented to person, place, and time.  Skin: Skin is warm and dry.  Psychiatric: She has a normal mood and affect. Her behavior is normal.  Nursing note and vitals reviewed.    ED Treatments / Results  Labs (all labs ordered are listed, but only abnormal results are displayed) Labs Reviewed  BASIC METABOLIC PANEL - Abnormal; Notable for the following:       Result Value   Calcium 8.8 (*)    All other components within normal limits  CBC WITH DIFFERENTIAL/PLATELET - Abnormal; Notable for the following:    WBC 12.2 (*)    Hemoglobin 10.3 (*)    HCT 31.8 (*)    MCV 75.2 (*)    MCH 24.3 (*)    RDW 17.8 (*)    Neutro Abs 10.8 (*)    All other components within normal limits  TROPONIN I  PREGNANCY, URINE    EKG  EKG Interpretation  Date/Time:  Thursday March 29 2016 14:22:15 EDT Ventricular Rate:  136 PR Interval:    QRS Duration: 74 QT Interval:  277 QTC Calculation: 417 R Axis:   57 Text Interpretation:  Age not entered, assumed to be  30 years old for purpose of ECG interpretation Sinus tachycardia Abnormal R-wave progression, early transition Borderline T abnormalities, inferior leads Confirmed by Lincoln Brigham [id number]) on  03/29/2016 2:33:30 PM       Radiology Dg Chest Port 1 View  Result Date: 03/29/2016 CLINICAL DATA:  Asthma exacerbation beginning last night. EXAM: PORTABLE CHEST 1 VIEW COMPARISON:  PA and lateral chest 07/05/2015. FINDINGS: There is peribronchial thickening. Lung volumes are unremarkable. No consolidative process, pneumothorax or effusion. Heart size is normal. No focal bony abnormality. IMPRESSION: Bronchitic change without focal process. Electronically Signed   By: Drusilla Kanner M.D.   On: 03/29/2016 14:27    Procedures Procedures (including critical care time)  CRITICAL CARE Performed by: Tilden Fossa   Total critical care time: 30 minutes  Critical care time was exclusive of separately billable procedures and treating other patients.  Critical  care was necessary to treat or prevent imminent or life-threatening deterioration.  Critical care was time spent personally by me on the following activities: development of treatment plan with patient and/or surrogate as well as nursing, discussions with consultants, evaluation of patient's response to treatment, examination of patient, obtaining history from patient or surrogate, ordering and performing treatments and interventions, ordering and review of laboratory studies, ordering and review of radiographic studies, pulse oximetry and re-evaluation of patient's condition.   Medications Ordered in ED Medications  albuterol (PROVENTIL) (2.5 MG/3ML) 0.083% nebulizer solution 5 mg (not administered)  albuterol (PROVENTIL, VENTOLIN) (5 MG/ML) 0.5% continuous inhalation solution (3 mg/hr  Given 03/29/16 1339)  ipratropium (ATROVENT) 0.02 % nebulizer solution (0.5 mg  Given 03/29/16 1339)  sodium chloride 0.9 % bolus 1,000 mL (1,000 mLs Intravenous New Bag/Given 03/29/16 1428)  methylPREDNISolone sodium succinate (SOLU-MEDROL) 125 mg/2 mL injection 125 mg (125 mg Intravenous Given 03/29/16 1428)  sodium chloride 0.9 % bolus 500 mL (0 mLs  Intravenous Stopped 03/29/16 1519)  magnesium sulfate IVPB 2 g 50 mL (0 g Intravenous Stopped 03/29/16 1553)     Initial Impression / Assessment and Plan / ED Course  I have reviewed the triage vital signs and the nursing notes.  Pertinent labs & imaging results that were available during my care of the patient were reviewed by me and considered in my medical decision making (see chart for details).  Clinical Course    Pt with hx/o asthma here with increased cough, SOB, in respiratory distress on ED arrival.  Pt with mild improvement in sxs with CAT, Magnesium.  She has persistent wheezing on exam, tachypnea.  Work of breathing did improve with time.  Plan to admit for treatment of asthma exacerbation.    Final Clinical Impressions(s) / ED Diagnoses   Final diagnoses:  Asthma exacerbation    New Prescriptions New Prescriptions   No medications on file     Tilden Fossa, MD 03/30/16 [phone]

## 2016-03-29 NOTE — ED Notes (Signed)
[facility] here to transport pt to [facility]

## 2016-03-30 DIAGNOSIS — J9601 Acute respiratory failure with hypoxia: Principal | ICD-10-CM

## 2016-03-30 DIAGNOSIS — J45901 Unspecified asthma with (acute) exacerbation: Secondary | ICD-10-CM

## 2016-03-30 DIAGNOSIS — F331 Major depressive disorder, recurrent, moderate: Secondary | ICD-10-CM

## 2016-03-30 DIAGNOSIS — E669 Obesity, unspecified: Secondary | ICD-10-CM

## 2016-03-30 DIAGNOSIS — R030 Elevated blood-pressure reading, without diagnosis of hypertension: Secondary | ICD-10-CM

## 2016-03-30 DIAGNOSIS — F411 Generalized anxiety disorder: Secondary | ICD-10-CM

## 2016-03-30 LAB — BASIC METABOLIC PANEL
Anion gap: 6 (ref 5–15)
BUN: 9 mg/dL (ref 6–20)
CHLORIDE: 111 mmol/L (ref 101–111)
CO2: 22 mmol/L (ref 22–32)
Calcium: 8.7 mg/dL — ABNORMAL LOW (ref 8.9–10.3)
Creatinine, Ser: 0.8 mg/dL (ref 0.44–1.00)
GFR calc non Af Amer: >60 mL/min (ref >60)
Glucose, Bld: 112 mg/dL — ABNORMAL HIGH (ref 65–99)
POTASSIUM: 4 mmol/L (ref 3.5–5.1)
SODIUM: 139 mmol/L (ref 135–145)

## 2016-03-30 LAB — RESPIRATORY PANEL BY PCR
ADENOVIRUS-RVPPCR: NOT DETECTED
BORDETELLA PERTUSSIS-RVPCR: NOT DETECTED
CHLAMYDOPHILA PNEUMONIAE-RVPPCR: NOT DETECTED
Coronavirus 229E: NOT DETECTED
Coronavirus HKU1: NOT DETECTED
Coronavirus NL63: NOT DETECTED
Coronavirus OC43: NOT DETECTED
INFLUENZA A-RVPPCR: NOT DETECTED
INFLUENZA B-RVPPCR: NOT DETECTED
MYCOPLASMA PNEUMONIAE-RVPPCR: NOT DETECTED
Metapneumovirus: NOT DETECTED
PARAINFLUENZA VIRUS 4-RVPPCR: NOT DETECTED
Parainfluenza Virus 1: NOT DETECTED
Parainfluenza Virus 2: NOT DETECTED
Parainfluenza Virus 3: NOT DETECTED
RESPIRATORY SYNCYTIAL VIRUS-RVPPCR: NOT DETECTED
Rhinovirus / Enterovirus: DETECTED — AB

## 2016-03-30 LAB — LACTIC ACID, PLASMA: Lactic Acid, Venous: 1.2 mmol/L (ref 0.5–1.9)

## 2016-03-30 LAB — CBC
HEMATOCRIT: 29.1 % — AB (ref 36.0–46.0)
HEMOGLOBIN: 9.4 g/dL — AB (ref 12.0–15.0)
MCH: 23.7 pg — AB (ref 26.0–34.0)
MCHC: 32.3 g/dL (ref 30.0–36.0)
MCV: 73.3 fL — ABNORMAL LOW (ref 78.0–100.0)
Platelets: 332 10*3/uL (ref 150–400)
RBC: 3.97 MIL/uL (ref 3.87–5.11)
RDW: 17.8 % — ABNORMAL HIGH (ref 11.5–15.5)
WBC: 16.8 10*3/uL — ABNORMAL HIGH (ref 4.0–10.5)

## 2016-03-30 LAB — HIV ANTIBODY (ROUTINE TESTING W REFLEX): HIV SCREEN 4TH GENERATION: NONREACTIVE

## 2016-03-30 MED ORDER — INFLUENZA VAC SPLIT QUAD 0.5 ML IM SUSY
0.5000 mL | PREFILLED_SYRINGE | INTRAMUSCULAR | Status: AC
  Administered 2016-03-31: 0.5 mL via INTRAMUSCULAR
  Filled 2016-03-30: qty 0.5

## 2016-03-30 MED ORDER — LEVALBUTEROL HCL 1.25 MG/0.5ML IN NEBU
1.2500 mg | INHALATION_SOLUTION | Freq: Four times a day (QID) | RESPIRATORY_TRACT | Status: DC
  Administered 2016-03-31 (×2): 1.25 mg via RESPIRATORY_TRACT
  Filled 2016-03-30 (×3): qty 0.5

## 2016-03-30 MED ORDER — IPRATROPIUM BROMIDE 0.02 % IN SOLN
0.5000 mg | Freq: Four times a day (QID) | RESPIRATORY_TRACT | Status: DC
  Administered 2016-03-31 (×2): 0.5 mg via RESPIRATORY_TRACT
  Filled 2016-03-30 (×2): qty 2.5

## 2016-03-30 NOTE — Progress Notes (Addendum)
CRITICAL VALUE ALERT  Critical value received:  Lactic acid-2.9  Date of notification:  03/29/16  Time of notification:  2332  Critical value read back:Yes.    Nurse who received alert:  S.Zollie Ellery,RN  MD notified (1st page):  K.Schorr,NP  Time of first page: 2340   MD notified (2nd page):  Time of second page:  Responding MD: Leodis RainsK. Schorr,NP  Time MD responded:  [phone]

## 2016-03-30 NOTE — Care Management Note (Signed)
Case Management Note  Patient Details  Name: Sylvia Parker MRN: [id number] Date of Birth: January 13, 1986  Subjective/Objective:        sepsis            Action/Plan: home Expected Discharge Date:                  Expected Discharge Plan:  Home/Self Care  In-House Referral:     Discharge planning Services     Post Acute Care Choice:    Choice offered to:     DME Arranged:    DME Agency:     HH Arranged:    HH Agency:     Status of Service:  In process, will continue to follow  If discussed at Long Length of Stay Meetings, dates discussed:    Additional Comments:Date:  March 30, 2016 Chart reviewed for concurrent status and case management needs. Will continue to follow the patient for changes and needs: Discharge Planning: following for needs Marcelle Smilinghonda Tayana Shankle, BSN, Center[place]3, [place]   [phone]  Golda Acreavis, Aidee Latimore Lynn, RN 03/30/2016, 10:53 AM

## 2016-03-30 NOTE — Progress Notes (Signed)
PROGRESS NOTE    Sylvia Parker  ZOX:[id number] DOB: August 11, 1985 DOA: 03/29/2016 PCP: Gus Rankin, PA-C   Chief Complaint  Patient presents with  . Asthma     Brief Narrative:  HPI on 03/29/2016 by Dr. Lorretta Harp Sylvia Parker is a 30 y.o. female with medical history significant of asthma, depression, anxiety, allergy, who presents with shortness present injury:  Patient states that she has been having SOB and dry cough for 3 days,  which has been progressively getting worse. She can speak in full sentences currently. She does not have fever or chills. No runny nose or throat. She has mild chest pain which is introduced by coughing. No tenderness over calf areas. She has nausea, and vomited twice due to severe coughing. No abdominal pain or diarrhea. Denies symptoms of UTI or unilateral weakness. no history of intubation.  Assessment & Plan   Acute hypoxic respiratory failure/asthma exacerbation -Upon presentation to the emergency department, SPO2 is 82% -Chest Xray showed bronchitic changes without focal process -Patient was given Solu-Medrol as well as magnesium sulfate and emergency department -Continue Solu-Medrol, nebulizer treatments, Mucinex -Respiratory viral panel positive for Raynaud virus/enterovirus  Lactic acidosis -Likely secondary to the above, was 2.9 upon admission. Currently 1.2  Depression/anxiety -Continue Mirtazapine, Zoloft  Hypertension -Possibly situational -Continue to monitor  Obesity -BMI 37.7 -Follow up with PCP upon discharge to discuss lifestyle modifications including diet and exercise  Leukocytosis -Likely reactive steroids, will continue to monitor CBC -Currently afebrile  DVT Prophylaxis  Lovenox  Code Status: Full  Family Communication: Mother at bedside  Disposition Plan: Admitted. Will transfer to telemetry today.  Consultants None  Procedures  None  Antibiotics   Anti-infectives    None       Subjective:   Sylvia Parker seen and examined today.  Patient continues to have a cough and shortness of breath however feels that it is improved. States her cough is nonproductive. Feels she is able to take more of a deep breath in. Denies chest pain, abdominal pain, nausea or vomiting, diarrhea constipation, headache or dizziness.  Objective:   Vitals:   03/30/16 0500 03/30/16 0600 03/30/16 0700 03/30/16 0812  BP: 140/79 126/77 137/82   Pulse: 87 94 89   Resp: (!) 30 (!) 31 20   Temp:    98 F (36.7 C)  TempSrc:    Oral  SpO2: 90% 92% 97% 95%  Weight:      Height:        Intake/Output Summary (Last 24 hours) at 03/30/16 1418 Last data filed at 03/30/16 8119  Gross per 24 hour  Intake          1281.67 ml  Output                0 ml  Net          1281.67 ml   Filed Weights   03/29/16 1334 03/29/16 1918  Weight: 90.7 kg (200 lb) 90.3 kg (199 lb 1.2 oz)    Exam  General: Well developed, well nourished, NAD, appears stated age  HEENT: NCAT, mucous membranes moist.   Cardiovascular: S1 S2 auscultated, no rubs, murmurs or gallops. Regular rate and rhythm.  Respiratory: Clear to auscultation bilaterally with equal chest rise  Abdomen: Soft, nontender, nondistended, + bowel sounds  Extremities: warm dry without cyanosis clubbing or edema  Neuro: AAOx3, nonfocal  Psych: Normal affect and demeanor with intact judgement and insight   Data Reviewed: I have  personally reviewed following labs and imaging studies  CBC:  Recent Labs Lab 03/29/16 1420 03/30/16 0749  WBC 12.2* 16.8*  NEUTROABS 10.8*  --   HGB 10.3* 9.4*  HCT 31.8* 29.1*  MCV 75.2* 73.3*  PLT 341 332   Basic Metabolic Panel:  Recent Labs Lab 03/29/16 1420 03/30/16 0749  NA 136 139  K 3.7 4.0  CL 105 111  CO2 22 22  GLUCOSE 99 112*  BUN 8 9  CREATININE 0.92 0.80  CALCIUM 8.8* 8.7*   GFR: Estimated Creatinine Clearance: 105.2 mL/min (by C-G formula based on SCr of 0.8  mg/dL). Liver Function Tests: No results for input(s): AST, ALT, ALKPHOS, BILITOT, PROT, ALBUMIN in the last 168 hours. No results for input(s): LIPASE, AMYLASE in the last 168 hours. No results for input(s): AMMONIA in the last 168 hours. Coagulation Profile: No results for input(s): INR, PROTIME in the last 168 hours. Cardiac Enzymes:  Recent Labs Lab 03/29/16 1420  TROPONINI <0.03   BNP (last 3 results) No results for input(s): PROBNP in the last 8760 hours. HbA1C: No results for input(s): HGBA1C in the last 72 hours. CBG: No results for input(s): GLUCAP in the last 168 hours. Lipid Profile: No results for input(s): CHOL, HDL, LDLCALC, TRIG, CHOLHDL, LDLDIRECT in the last 72 hours. Thyroid Function Tests: No results for input(s): TSH, T4TOTAL, FREET4, T3FREE, THYROIDAB in the last 72 hours. Anemia Panel: No results for input(s): VITAMINB12, FOLATE, FERRITIN, TIBC, IRON, RETICCTPCT in the last 72 hours. Urine analysis:    Component Value Date/Time   BILIRUBINUR neg 12/07/2012 1113   PROTEINUR neg 12/07/2012 1113   UROBILINOGEN 0.2 12/07/2012 1113   NITRITE neg 12/07/2012 1113   LEUKOCYTESUR Trace 12/07/2012 1113   Sepsis Labs: @LABRCNTIP (procalcitonin:4,lacticidven:4)  ) Recent Results (from the past 240 hour(s))  MRSA PCR Screening     Status: None   Collection Time: 03/29/16  7:22 PM  Result Value Ref Range Status   MRSA by PCR NEGATIVE NEGATIVE Final    Comment:        The GeneXpert MRSA Assay (FDA approved for NASAL specimens only), is one component of a comprehensive MRSA colonization surveillance program. It is not intended to diagnose MRSA infection nor to guide or monitor treatment for MRSA infections.   Respiratory Panel by PCR     Status: Abnormal   Collection Time: 03/29/16 11:10 PM  Result Value Ref Range Status   Adenovirus NOT DETECTED NOT DETECTED Final   Coronavirus 229E NOT DETECTED NOT DETECTED Final   Coronavirus HKU1 NOT DETECTED NOT  DETECTED Final   Coronavirus NL63 NOT DETECTED NOT DETECTED Final   Coronavirus OC43 NOT DETECTED NOT DETECTED Final   Metapneumovirus NOT DETECTED NOT DETECTED Final   Rhinovirus / Enterovirus DETECTED (A) NOT DETECTED Final   Influenza A NOT DETECTED NOT DETECTED Final   Influenza B NOT DETECTED NOT DETECTED Final   Parainfluenza Virus 1 NOT DETECTED NOT DETECTED Final   Parainfluenza Virus 2 NOT DETECTED NOT DETECTED Final   Parainfluenza Virus 3 NOT DETECTED NOT DETECTED Final   Parainfluenza Virus 4 NOT DETECTED NOT DETECTED Final   Respiratory Syncytial Virus NOT DETECTED NOT DETECTED Final   Bordetella pertussis NOT DETECTED NOT DETECTED Final   Chlamydophila pneumoniae NOT DETECTED NOT DETECTED Final   Mycoplasma pneumoniae NOT DETECTED NOT DETECTED Final    Comment[facility]      Radiology Studies: Dg Chest Port 1 View  Result Date: 03/29/2016 CLINICAL DATA:  Asthma exacerbation beginning last night. EXAM: PORTABLE CHEST 1 VIEW COMPARISON:  PA and lateral chest 07/05/2015. FINDINGS: There is peribronchial thickening. Lung volumes are unremarkable. No consolidative process, pneumothorax or effusion. Heart size is normal. No focal bony abnormality. IMPRESSION: Bronchitic change without focal process. Electronically Signed   By: Drusilla Kanner M.D.   On: 03/29/2016 14:27     Scheduled Meds: . dextromethorphan-guaiFENesin  1 tablet Oral BID  . enoxaparin (LOVENOX) injection  40 mg Subcutaneous QHS  . [START ON 03/31/2016] Influenza vac split quadrivalent PF  0.5 mL Intramuscular Tomorrow-1000  . ipratropium  0.5 mg Nebulization Q4H  . levalbuterol  0.63 mg Nebulization Q4H  . methylPREDNISolone sodium succinate  60 mg Intravenous TID  . pregabalin  150 mg Oral BID  . sertraline  200 mg Oral BH-q7a   Continuous Infusions: . sodium chloride 100 mL/hr at 03/30/16 1128     LOS: 1 day   Time Spent in minutes   30 minutes  Sylvia Parker D.O. on  03/30/2016 at 2:18 PM  Between 7am to 7pm - Pager - [phone]  After 7pm go to [email or web address] - password [email or web address]  And look for the night coverage person covering for me after hours  [facility] Office  [phone]

## 2016-03-31 DIAGNOSIS — D509 Iron deficiency anemia, unspecified: Secondary | ICD-10-CM

## 2016-03-31 LAB — BASIC METABOLIC PANEL
ANION GAP: 8 (ref 5–15)
BUN: 15 mg/dL (ref 6–20)
CO2: 21 mmol/L — AB (ref 22–32)
Calcium: 8.9 mg/dL (ref 8.9–10.3)
Chloride: 110 mmol/L (ref 101–111)
Creatinine, Ser: 0.91 mg/dL (ref 0.44–1.00)
GFR calc Af Amer: >60 mL/min (ref >60)
GFR calc non Af Amer: >60 mL/min (ref >60)
GLUCOSE: 109 mg/dL — AB (ref 65–99)
POTASSIUM: 4.4 mmol/L (ref 3.5–5.1)
Sodium: 139 mmol/L (ref 135–145)

## 2016-03-31 LAB — CBC
HEMATOCRIT: 28.9 % — AB (ref 36.0–46.0)
Hemoglobin: 9.6 g/dL — ABNORMAL LOW (ref 12.0–15.0)
MCH: 24.5 pg — ABNORMAL LOW (ref 26.0–34.0)
MCHC: 33.2 g/dL (ref 30.0–36.0)
MCV: 73.7 fL — AB (ref 78.0–100.0)
Platelets: 323 10*3/uL (ref 150–400)
RBC: 3.92 MIL/uL (ref 3.87–5.11)
RDW: 17.8 % — ABNORMAL HIGH (ref 11.5–15.5)
WBC: 16.9 10*3/uL — AB (ref 4.0–10.5)

## 2016-03-31 MED ORDER — PREDNISONE 20 MG PO TABS: ORAL_TABLET | ORAL | 0 refills | Status: DC

## 2016-03-31 MED ORDER — DM-GUAIFENESIN ER 30-600 MG PO TB12: 1.0000 | ORAL_TABLET | Freq: Two times a day (BID) | ORAL | 0 refills | Status: DC

## 2016-03-31 NOTE — Discharge Summary (Signed)
Physician Discharge Summary  Sylvia MinisterKarren Thurman Parker XBJ:[id number] DOB: 1985-11-07 DOA: 03/29/2016  PCP: Sylvia RankinMelvin, Sylvia N, PA-C  Admit date: 03/29/2016 Discharge date: 03/31/2016  Time spent: 45 minutes  Recommendations for Outpatient Follow-up:  Patient will be discharged to home.  Patient will need to follow up with primary care provider within one week of discharge, repeat CBC.  Patient should continue medications as prescribed.  Patient should follow a regular diet.   Discharge Diagnoses:  Principal Problem:   Acute respiratory failure with hypoxia (HCC) Active Problems:   MDD (major depressive disorder), recurrent episode, moderate (HCC)   GAD (generalized anxiety disorder)   Asthma exacerbation   Discharge Condition: Stable  Diet recommendation: Regular  Filed Weights   03/29/16 1334 03/29/16 1918  Weight: 90.7 kg (200 lb) 90.3 kg (199 lb 1.2 oz)    History of present illness:  on 03/29/2016 by Dr. Angela CoxXilin Niu Sylvia Thurman Woolridgeis a 29 y.o.femalewith medical history significant of asthma, depression, anxiety, allergy, who presents with shortness present injury:  Patient states that she has been having SOB and dry cough for 3 days, which has been progressively getting worse. She can speak in full sentences currently. She does not have fever or chills. No runny nose or throat. She has mild chest pain which is introduced by coughing. No tenderness over calf areas. She hasnausea, and vomited twice due to severe coughing. No abdominal pain or diarrhea. Denies symptoms of UTI or unilateral weakness. nohistory of intubation.  Hospital Course:  Acute hypoxic respiratory failure/asthma exacerbation -Upon presentation to the emergency department, SPO2 is 82% -Chest Xray showed bronchitic changes without focal process -Patient was given Solu-Medrol as well as magnesium sulfate and emergency department -Was placed on Solu-Medrol, nebulizer treatments,  Mucinex -Respiratory viral panel positive for Rhinovirus/enterovirus -Will discharge with prednisone taper -Patient weaned off of O2. Maintaining oxygen saturations in high 90s on room air (influenza vaccine given)  Lactic acidosis -Likely secondary to the above, was 2.9 upon admission. Currently 1.2  Depression/anxiety -Continue Mirtazapine, Zoloft  Hypertension -Possibly situational -Resolved  Obesity -BMI 37.7 -Follow up with PCP upon discharge to discuss lifestyle modifications including diet and exercise  Leukocytosis -Likely reactive steroids, will continue to monitor CBC -Currently afebrile -Repeat CBC in 1-2 weeks  Chronic microcytic anemia -Hemoglobin currently 9.6 -Repeat CBC in one week  Consultants None  Procedures  None  Discharge Exam: Vitals:   03/30/16 2115 03/31/16 0642  BP: (!) 119/100 126/76  Pulse: 81 70  Resp: 17 17  Temp: 97.8 F (36.6 C) 97.7 F (36.5 C)   Exam  General: Well developed, well nourished, NAD, appears stated age  HEENT: NCAT, mucous membranes moist.   Cardiovascular: S1 S2 auscultated, RRR, no murmurs  Respiratory: Mild exp wheezing, good air movement  Abdomen: Soft, obese, nontender, nondistended, + bowel sounds  Extremities: warm dry without cyanosis clubbing or edema  Neuro: AAOx3, nonfocal  Psych: Normal affect and demeanor with intact judgement and insight, pleasant  Discharge Instructions Discharge Instructions    Discharge instructions    Complete by:  As directed    Patient will be discharged to home.  Patient will need to follow up with primary care provider within one week of discharge, repeat CBC.  Patient should continue medications as prescribed.  Patient should follow a regular diet.     Current Discharge Medication List    START taking these medications   Details  dextromethorphan-guaiFENesin (MUCINEX DM) 30-600 MG 12hr tablet Take 1 tablet by mouth 2 (  two) times daily. Qty: 14 tablet,  Refills: 0    predniSONE (DELTASONE) 20 MG tablet Take  60mg  (3 tabs) x 3 days, then taper to 40mg  (2 tabs) x 3 days, then 20mg  (1 tab) x 3days Qty: 18 tablet, Refills: 0      CONTINUE these medications which have NOT CHANGED   Details  albuterol (PROVENTIL HFA;VENTOLIN HFA) 108 (90 Base) MCG/ACT inhaler Inhale 1-2 puffs into the lungs every 6 (six) hours as needed for wheezing or shortness of breath. Qty: 1 Inhaler, Refills: 0    methocarbamol (ROBAXIN) 500 MG tablet Take 2 tablets (1,000 mg total) by mouth 4 (four) times daily as needed (Pain). Qty: 20 tablet, Refills: 0    mirtazapine (REMERON) 15 MG tablet Take 1 tablet (15 mg total) by mouth at bedtime. Qty: 30 tablet, Refills: 1   Associated Diagnoses: Major depressive disorder, recurrent episode, moderate (HCC); GAD (generalized anxiety disorder); Insomnia    pregabalin (LYRICA) 150 MG capsule Take 150 mg by mouth 2 (two) times daily.    sertraline (ZOLOFT) 100 MG tablet Take 2 tablets (200 mg total) by mouth daily. Qty: 60 tablet, Refills: 1   Associated Diagnoses: Major depressive disorder, recurrent episode, moderate (HCC); GAD (generalized anxiety disorder)      STOP taking these medications     busPIRone (BUSPAR) 15 MG tablet      HYDROcodone-acetaminophen (NORCO/VICODIN) 5-325 MG per tablet        No Known Allergies Follow-up Information    Sylvia Rankin, PA-C. Schedule an appointment as soon as possible for a visit in 1 week(s).   Specialty:  Pulmonary Disease Why:  Hospital follow up Contact information: [address] [place] [address] [phone]            The results of significant diagnostics from this hospitalization (including imaging, microbiology, ancillary and laboratory) are listed below for reference.    Significant Diagnostic Studies: Dg Chest Port 1 View  Result Date: 03/29/2016 CLINICAL DATA:  Asthma exacerbation beginning last night. EXAM: PORTABLE CHEST 1 VIEW  COMPARISON:  PA and lateral chest 07/05/2015. FINDINGS: There is peribronchial thickening. Lung volumes are unremarkable. No consolidative process, pneumothorax or effusion. Heart size is normal. No focal bony abnormality. IMPRESSION: Bronchitic change without focal process. Electronically Signed   By: Drusilla Kanner M.D.   On: 03/29/2016 14:27    Microbiology: Recent Results (from the past 240 hour(s))  MRSA PCR Screening     Status: None   Collection Time: 03/29/16  7:22 PM  Result Value Ref Range Status   MRSA by PCR NEGATIVE NEGATIVE Final    Comment:        The GeneXpert MRSA Assay (FDA approved for NASAL specimens only), is one component of a comprehensive MRSA colonization surveillance program. It is not intended to diagnose MRSA infection nor to guide or monitor treatment for MRSA infections.   Respiratory Panel by PCR     Status: Abnormal   Collection Time: 03/29/16 11:10 PM  Result Value Ref Range Status   Adenovirus NOT DETECTED NOT DETECTED Final   Coronavirus 229E NOT DETECTED NOT DETECTED Final   Coronavirus HKU1 NOT DETECTED NOT DETECTED Final   Coronavirus NL63 NOT DETECTED NOT DETECTED Final   Coronavirus OC43 NOT DETECTED NOT DETECTED Final   Metapneumovirus NOT DETECTED NOT DETECTED Final   Rhinovirus / Enterovirus DETECTED (A) NOT DETECTED Final   Influenza A NOT DETECTED NOT DETECTED Final   Influenza B NOT DETECTED  NOT DETECTED Final   Parainfluenza Virus 1 NOT DETECTED NOT DETECTED Final   Parainfluenza Virus 2 NOT DETECTED NOT DETECTED Final   Parainfluenza Virus 3 NOT DETECTED NOT DETECTED Final   Parainfluenza Virus 4 NOT DETECTED NOT DETECTED Final   Respiratory Syncytial Virus NOT DETECTED NOT DETECTED Final   Bordetella pertussis NOT DETECTED NOT DETECTED Final   Chlamydophila pneumoniae NOT DETECTED NOT DETECTED Final   Mycoplasma pneumoniae NOT DETECTED NOT DETECTED Final    Comment: Performed at [facility]     Labs: Basic  Metabolic Panel:  Recent Labs Lab 03/29/16 1420 03/30/16 0749 03/31/16 0556  NA 136 139 139  K 3.7 4.0 4.4  CL 105 111 110  CO2 22 22 21*  GLUCOSE 99 112* 109*  BUN 8 9 15   CREATININE 0.92 0.80 0.91  CALCIUM 8.8* 8.7* 8.9   Liver Function Tests: No results for input(s): AST, ALT, ALKPHOS, BILITOT, PROT, ALBUMIN in the last 168 hours. No results for input(s): LIPASE, AMYLASE in the last 168 hours. No results for input(s): AMMONIA in the last 168 hours. CBC:  Recent Labs Lab 03/29/16 1420 03/30/16 0749 03/31/16 0556  WBC 12.2* 16.8* 16.9*  NEUTROABS 10.8*  --   --   HGB 10.3* 9.4* 9.6*  HCT 31.8* 29.1* 28.9*  MCV 75.2* 73.3* 73.7*  PLT 341 332 323   Cardiac Enzymes:  Recent Labs Lab 03/29/16 1420  TROPONINI <0.03   BNP: BNP (last 3 results) No results for input(s): BNP in the last 8760 hours.  ProBNP (last 3 results) No results for input(s): PROBNP in the last 8760 hours.  CBG: No results for input(s): GLUCAP in the last 168 hours.     SignedEdsel Petrin  [facility] 03/31/2016, 10:23 AM

## 2016-03-31 NOTE — Discharge Instructions (Signed)
Asthma, Adult Asthma is a recurring condition in which the airways tighten and narrow. Asthma can make it difficult to breathe. It can cause coughing, wheezing, and shortness of breath. Asthma episodes, also called asthma attacks, range from minor to life-threatening. Asthma cannot be cured, but medicines and lifestyle changes can help control it. CAUSES Asthma is believed to be caused by inherited (genetic) and environmental factors, but its exact cause is unknown. Asthma may be triggered by allergens, lung infections, or irritants in the air. Asthma triggers are different for each person. Common triggers include:   Animal dander.  Dust mites.  Cockroaches.  Pollen from trees or grass.  Mold.  Smoke.  Air pollutants such as dust, household cleaners, hair sprays, aerosol sprays, paint fumes, strong chemicals, or strong odors.  Cold air, weather changes, and winds (which increase molds and pollens in the air).  Strong emotional expressions such as crying or laughing hard.  Stress.  Certain medicines (such as aspirin) or types of drugs (such as beta-blockers).  Sulfites in foods and drinks. Foods and drinks that may contain sulfites include dried fruit, potato chips, and sparkling grape juice.  Infections or inflammatory conditions such as the flu, a cold, or an inflammation of the nasal membranes (rhinitis).  Gastroesophageal reflux disease (GERD).  Exercise or strenuous activity. SYMPTOMS Symptoms may occur immediately after asthma is triggered or many hours later. Symptoms include:  Wheezing.  Excessive nighttime or early morning coughing.  Frequent or severe coughing with a common cold.  Chest tightness.  Shortness of breath. DIAGNOSIS  The diagnosis of asthma is made by a review of your medical history and a physical exam. Tests may also be performed. These may include:  Lung function studies. These tests show how much air you breathe in and out.  Allergy  tests.  Imaging tests such as X-rays. TREATMENT  Asthma cannot be cured, but it can usually be controlled. Treatment involves identifying and avoiding your asthma triggers. It also involves medicines. There are 2 classes of medicine used for asthma treatment:   Controller medicines. These prevent asthma symptoms from occurring. They are usually taken every day.  Reliever or rescue medicines. These quickly relieve asthma symptoms. They are used as needed and provide short-term relief. Your health care provider will help you create an asthma action plan. An asthma action plan is a written plan for managing and treating your asthma attacks. It includes a list of your asthma triggers and how they may be avoided. It also includes information on when medicines should be taken and when their dosage should be changed. An action plan may also involve the use of a device called a peak flow meter. A peak flow meter measures how well the lungs are working. It helps you monitor your condition. HOME CARE INSTRUCTIONS   Take medicines only as directed by your health care provider. Speak with your health care provider if you have questions about how or when to take the medicines.  Use a peak flow meter as directed by your health care provider. Record and keep track of readings.  Understand and use the action plan to help minimize or stop an asthma attack without needing to seek medical care.  Control your home environment in the following ways to help prevent asthma attacks:  Do not smoke. Avoid being exposed to secondhand smoke.  Change your heating and air conditioning filter regularly.  Limit your use of fireplaces and wood stoves.  Get rid of pests (such as roaches   and mice) and their droppings.  Throw away plants if you see mold on them.  Clean your floors and dust regularly. Use unscented cleaning products.  Try to have someone else vacuum for you regularly. Stay out of rooms while they are  being vacuumed and for a short while afterward. If you vacuum, use a dust mask from a hardware store, a double-layered or microfilter vacuum cleaner bag, or a vacuum cleaner with a HEPA filter.  Replace carpet with wood, tile, or vinyl flooring. Carpet can trap dander and dust.  Use allergy-proof pillows, mattress covers, and box spring covers.  Wash bed sheets and blankets every week in hot water and dry them in a dryer.  Use blankets that are made of polyester or cotton.  Clean bathrooms and kitchens with bleach. If possible, have someone repaint the walls in these rooms with mold-resistant paint. Keep out of the rooms that are being cleaned and painted.  Wash hands frequently. SEEK MEDICAL CARE IF:   You have wheezing, shortness of breath, or a cough even if taking medicine to prevent attacks.  The colored mucus you cough up (sputum) is thicker than usual.  Your sputum changes from clear or white to yellow, green, gray, or bloody.  You have any problems that may be related to the medicines you are taking (such as a rash, itching, swelling, or trouble breathing).  You are using a reliever medicine more than 2-3 times per week.  Your peak flow is still at 50-79% of your personal best after following your action plan for 1 hour.  You have a fever. SEEK IMMEDIATE MEDICAL CARE IF:   You seem to be getting worse and are unresponsive to treatment during an asthma attack.  You are short of breath even at rest.  You get short of breath when doing very little physical activity.  You have difficulty eating, drinking, or talking due to asthma symptoms.  You develop chest pain.  You develop a fast heartbeat.  You have a bluish color to your lips or fingernails.  You are light-headed, dizzy, or faint.  Your peak flow is less than 50% of your personal best.   This information is not intended to replace advice given to you by your health care provider. Make sure you discuss any  questions you have with your health care provider.   Document Released: 06/25/2005 Document Revised: 03/16/2015 Document Reviewed: 01/22/2013 Elsevier Interactive Patient Education 2016 Elsevier Inc.  

## 2016-03-31 NOTE — Progress Notes (Signed)
Pt leaving at this time with her mother. Alert, oriented, and without c/o. Discharge instructions/prescriptions given/explained with pt verbalizing understanding. Pt aware to pickup prescriptions at said pharmacy. Pt aware of followup appt.

## 2016-04-03 IMAGING — DX DG ANKLE COMPLETE 3+V*R*
3 series · 3 of 3 positions shown · non-contrast
Comparison: None.

CLINICAL DATA: 28-year-old with lateral ankle pain after fall
earlier today

EXAM:
RIGHT ANKLE - COMPLETE 3+ VIEW

[ankle ap]
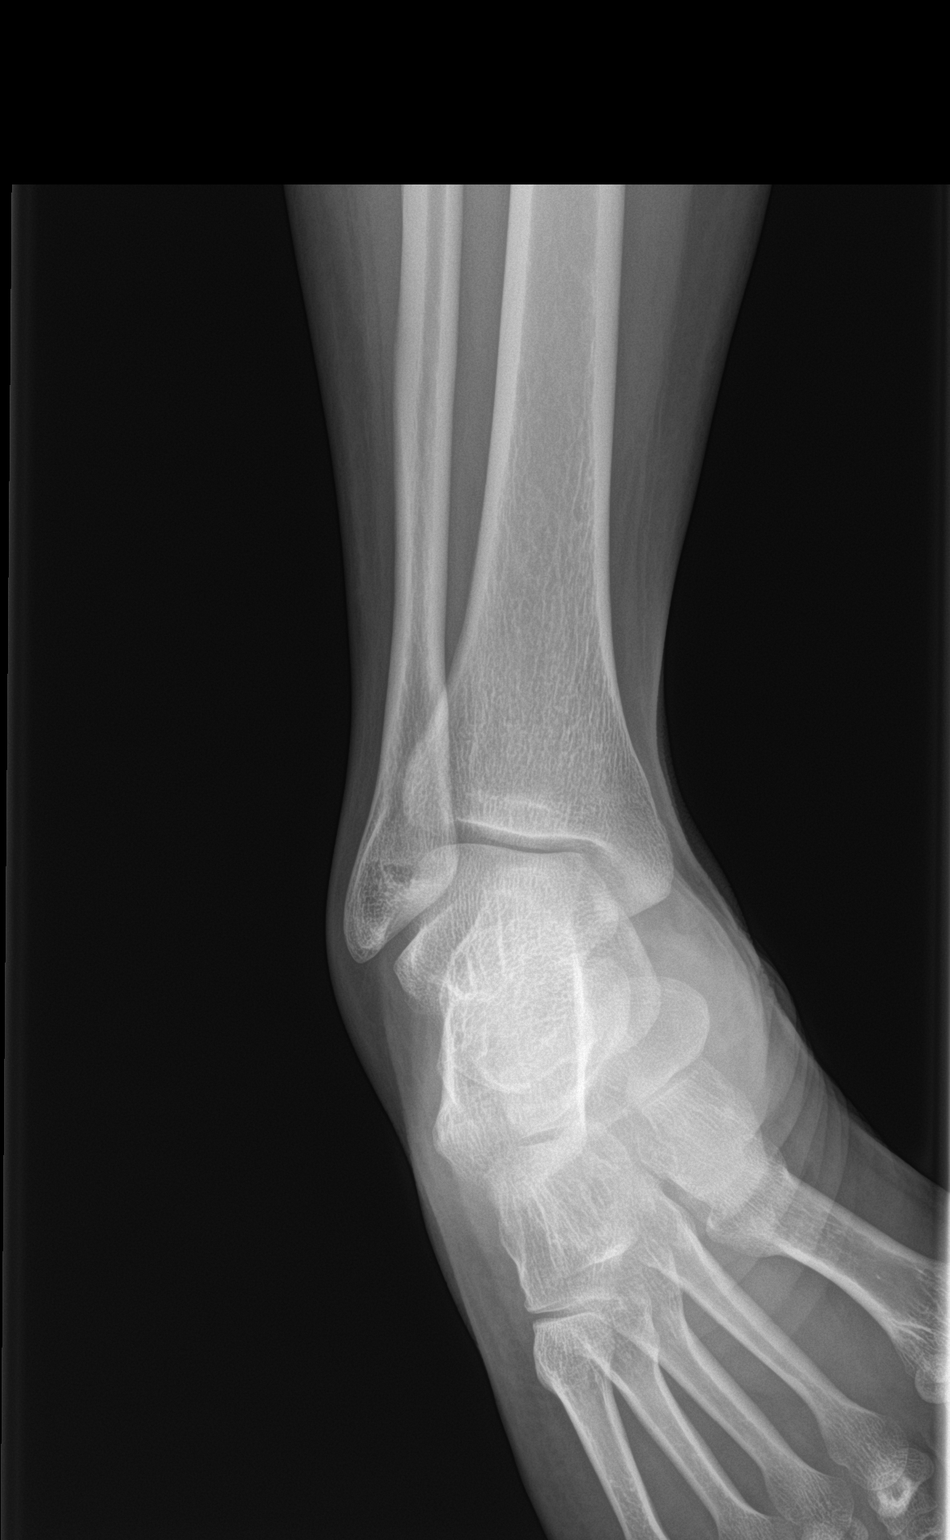

[ankle obl]
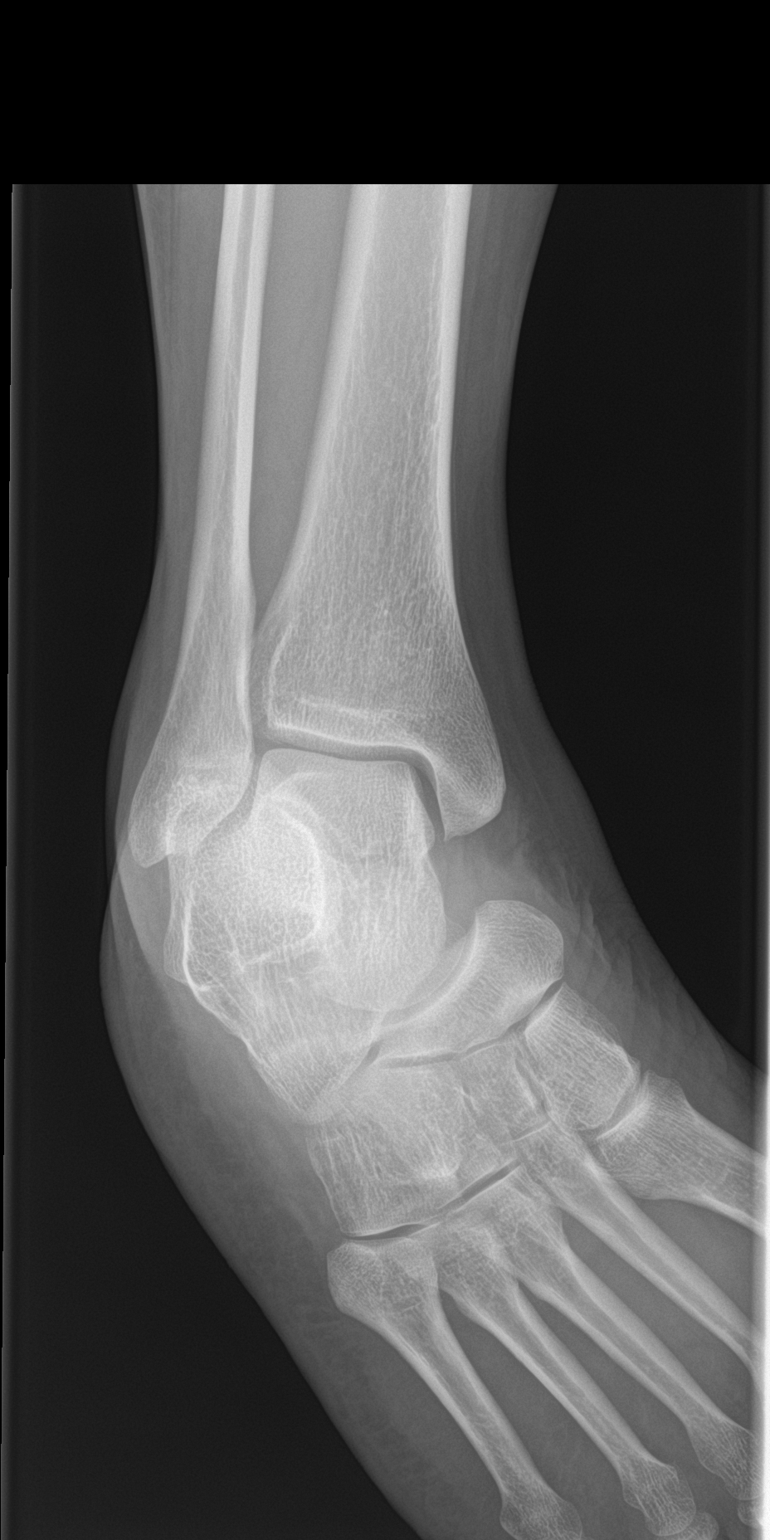

[ankle lat]
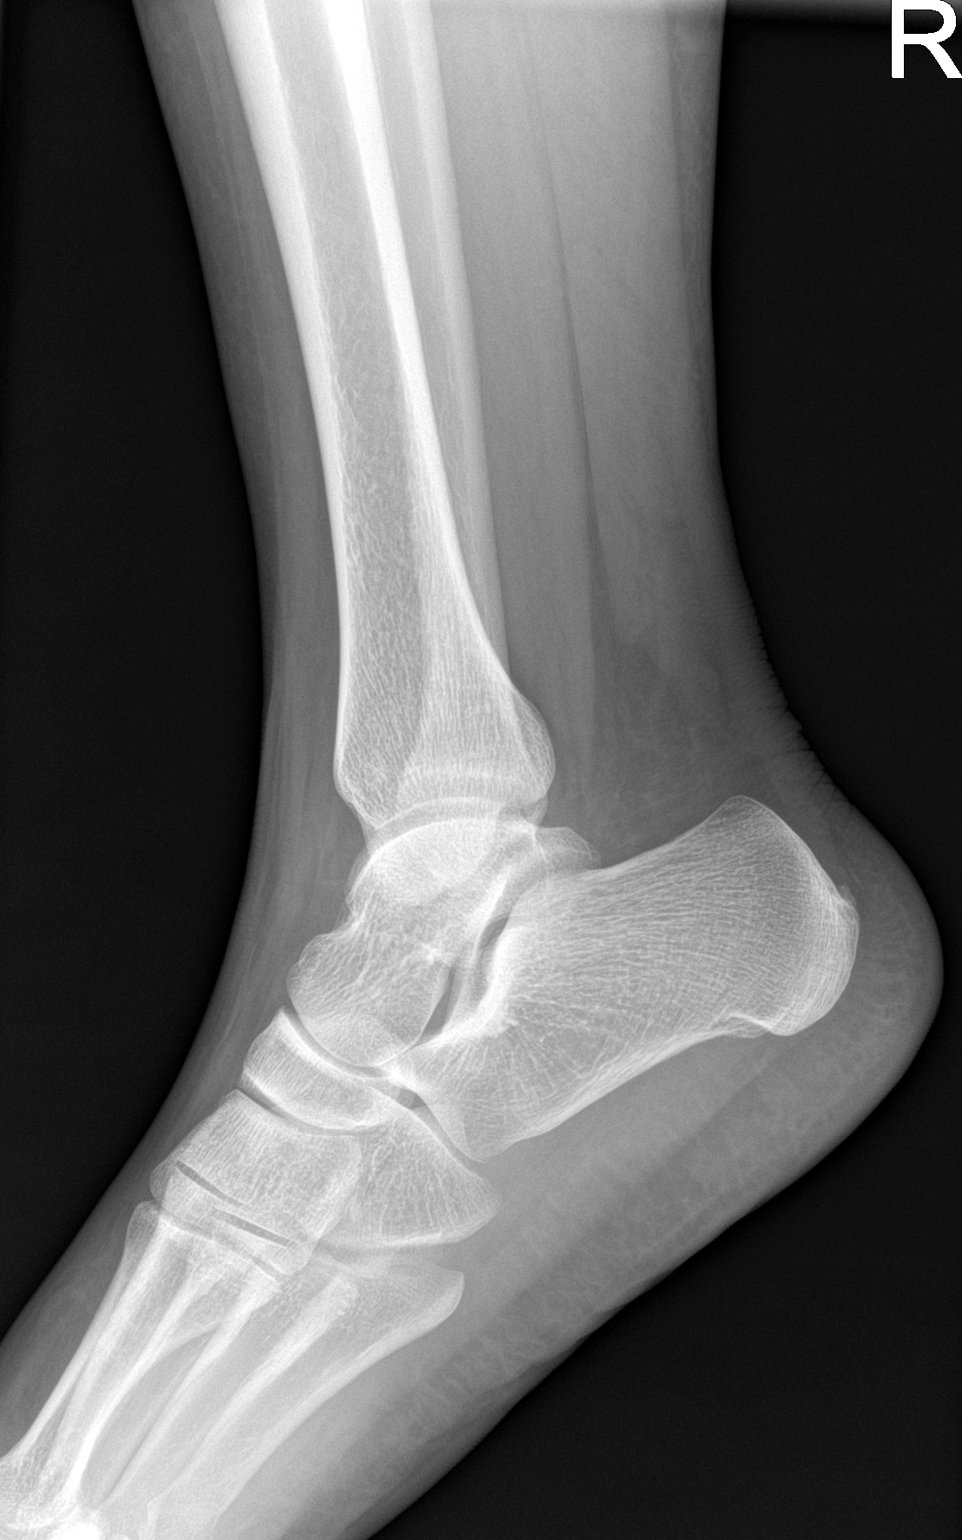

[3 of 3 positions shown; findings below may reference images not displayed]

FINDINGS: There is no evidence of fracture, dislocation, or joint effusion.
There is no evidence of arthropathy or other focal bone abnormality.
Soft tissues are unremarkable.
IMPRESSION: Negative.

## 2016-04-05 ENCOUNTER — Ambulatory Visit (INDEPENDENT_AMBULATORY_CARE_PROVIDER_SITE_OTHER): Payer: BLUE CROSS/BLUE SHIELD | Admitting: Psychiatry

## 2016-04-05 ENCOUNTER — Encounter (HOSPITAL_COMMUNITY): Payer: Self-pay | Admitting: Psychiatry

## 2016-04-05 DIAGNOSIS — F411 Generalized anxiety disorder: Secondary | ICD-10-CM

## 2016-04-05 DIAGNOSIS — F331 Major depressive disorder, recurrent, moderate: Secondary | ICD-10-CM

## 2016-04-05 DIAGNOSIS — G4700 Insomnia, unspecified: Secondary | ICD-10-CM

## 2016-04-05 MED ORDER — SERTRALINE HCL 100 MG PO TABS: 200.0000 mg | ORAL_TABLET | ORAL | 3 refills | Status: AC

## 2016-04-05 MED ORDER — MIRTAZAPINE 30 MG PO TABS: 30.0000 mg | ORAL_TABLET | Freq: Every day | ORAL | 3 refills | Status: AC

## 2016-04-05 NOTE — Progress Notes (Signed)
Patient ID: Sylvia Parker, female   DOB: Jul 13, 1985, 30 y.o.   MRN: [id number] Patient ID: Sylvia Parker, female   DOB: [birth year], 30 y.o.   MRN: [id number]  [facility] [id number] Progress Note  Sylvia Parker [id number] 30 y.o.  04/05/2016 8:51 AM  Chief Complaint: its getting wrose  History of Present Illness: Pt was inpt at [facility] for 3 days due to severe asthma attack. Reports she is now on Prednisone but is starting to feel better.   Depression is a little better. She feels sad about 2-3x/week and some weeks are better than others. She can usually make herself feel better by reaching out to social supports. Reports anhedonia and crying spells are improving. Denies isolation, hopelessness. Denies SI/HI.   Sleep is fair most nights with Remeron. She is getting about 6 hrs of unbroken sleep. Energy is improving.  Appetite is decreased even more and she is eating maybe once/day due to GI upset. Concentration is poor.    Anxiety is very high and is getting worse with Prednisone. She can't focus due to her anxious. Her work performance has declined significantly. In September she called out of work 4 days. Some days it is debilitating and she can't get out of bed.   Taking meds as prescribed and endorsing SE of fatigue and random dizziness with anxiety.   Pt is still trying therapy thru [facility]. States it was on the phone and it helped.   Suicidal Ideation: No Plan Formed: No Patient has means to carry out plan: No  Homicidal Ideation: No Plan Formed: No Patient has means to carry out plan: No  Review of Systems: Psychiatric: Agitation: Yes Hallucination: No Depressed Mood: Yes Insomnia: No Hypersomnia: No Altered Concentration: Yes Feels Worthless: No Grandiose Ideas: No Belief In Special Powers: No New/Increased Substance Abuse: No smoking THC less often Compulsions: No  Neurologic: Headache: Yes Seizure: No Paresthesias: No    Review of Systems  Constitutional: Positive for malaise/fatigue. Negative for chills and fever.  HENT: Negative for congestion, ear pain, hearing loss, nosebleeds and sore throat.   Eyes: Negative for blurred vision, double vision, photophobia and redness.  Respiratory: Negative for cough, shortness of breath and wheezing.   Cardiovascular: Negative for chest pain, palpitations and leg swelling.  Gastrointestinal: Positive for abdominal pain, diarrhea and heartburn. Negative for nausea and vomiting.  Musculoskeletal: Negative for back pain, joint pain and neck pain.  Skin: Negative for itching and rash.  Neurological: Positive for dizziness and headaches. Negative for tremors, sensory change, seizures and loss of consciousness.  Psychiatric/Behavioral: Positive for depression and substance abuse. Negative for hallucinations and suicidal ideas. The patient is nervous/anxious and has insomnia.      Past Medical Family, Social History: living with wife. No kids. Working at [employer] for last 4 yrs. raised by mom and dad separately. Parents divorced when she was 7. Has 2 brother and 2 sisters. Pt is in the middle. States it was hard b/c her dad wasn't there and mom was depressed.  reports that she has never smoked. She has never used smokeless tobacco. She reports that she drinks alcohol. She reports that she uses drugs, including Marijuana.  Family History  Problem Relation Age of Onset  . Kidney failure Father   . Cancer Maternal Grandfather   . Cancer Paternal Grandfather   . Depression Mother   . Suicidality Neg Hx   . Seizures Neg Hx   . Schizophrenia Neg Hx   .  Anxiety disorder Neg Hx     Past Medical History:  Diagnosis Date  . Allergy   . Asthma   . Depression    Outpatient Encounter Prescriptions as of 04/05/2016  Medication Sig  . albuterol (PROVENTIL HFA;VENTOLIN HFA) 108 (90 Base) MCG/ACT inhaler Inhale 1-2 puffs into the lungs every 6 (six) hours as needed  for wheezing or shortness of breath.  . dextromethorphan-guaiFENesin (MUCINEX DM) 30-600 MG 12hr tablet Take 1 tablet by mouth 2 (two) times daily.  . methocarbamol (ROBAXIN) 500 MG tablet Take 2 tablets (1,000 mg total) by mouth 4 (four) times daily as needed (Pain).  . mirtazapine (REMERON) 15 MG tablet Take 1 tablet (15 mg total) by mouth at bedtime. (Patient taking differently: Take 15 mg by mouth at bedtime as needed (sleep). )  . predniSONE (DELTASONE) 20 MG tablet Take  60mg  (3 tabs) x 3 days, then taper to 40mg  (2 tabs) x 3 days, then 20mg  (1 tab) x 3days  . pregabalin (LYRICA) 150 MG capsule Take 150 mg by mouth 2 (two) times daily.  . sertraline (ZOLOFT) 100 MG tablet Take 2 tablets (200 mg total) by mouth daily. (Patient taking differently: Take 200 mg by mouth every morning. )   No facility-administered encounter medications on file as of 04/05/2016.     Past Psychiatric History/Hospitalization(s): Anxiety: No Bipolar Disorder: No Depression: No Mania: No Psychosis: No Schizophrenia: No Personality Disorder: No Hospitalization for psychiatric illness: No History of Electroconvulsive Shock Therapy: No Prior Suicide Attempts: Yes  Physical Exam: Constitutional:  BP 128/85 (BP Location: Right Arm, Patient Position: Sitting, Cuff Size: Normal)   Pulse 78   Resp 12   Ht 5\' 1"  (1.549 m)   Wt 196 lb 12.8 oz (89.3 kg)   LMP 03/26/2016   BMI 37.19 kg/m   General Appearance: alert, oriented, no acute distress  Musculoskeletal: Strength & Muscle Tone: within normal limits Gait & Station: normal Patient leans: straight  Mental Status Examination/Evaluation: Objective: Attitude: Calm and cooperative  Appearance: Fairly Groomed and Neat, appears to be stated age  Eye Contact::  Good  Speech:  Clear and Coherent and Normal Rate  Volume:  Normal  Mood: anxious and depressed  Affect:  Congruent- seems calmer today than at previous visits  Thought Process:  Goal Directed,  Linear and Logical  Orientation:  Full (Time, Place, and Person)  Thought Content:  Negative  Suicidal Thoughts:  No  Homicidal Thoughts:  No  Judgement:  Good  Insight:  Good  Concentration: good  Memory: Immediate-good Recent-good Remote-good  Recall: fair  Language: fair  Gait and Station: normal  [employer] of Knowledge: average  Psychomotor Activity:  Normal  Akathisia:  No  Handed:  Right  AIMS (if indicated): n/a  Assets:  Communication Skills Desire for Improvement Housing Intimacy Social Support Talents/Skills Transportation Vocational/Educational        Assessment: Generalized Anxiety Disorder and Major Depression, Recurrent moderate; Insomnia  Deferred   Treatment Plan/Recommendations:  Plan of Care: Medication management with supportive therapy. Risks/benefits and SE of the medication discussed. Pt verbalized understanding and verbal consent obtained for treatment. Affirm with the patient that the medications are taken as ordered. Patient expressed understanding of how their medications were to be used.    Laboratory: 03/31/2016  CBC- HB 9.6, CMP glu 109    Psychotherapy: Therapy: brief supportive therapy provided. Discussed psychosocial stressors in detail.    Medications: Zoloft 200mg  po qD for depression and anxiety Increase Remeron 30mg  po  qHS for mood, anxiety and sleep   Routine PRN Medications: Yes  Consultations: encouraged to continue individual therapy    Safety Concerns: Pt denies SI and is at an acute low risk for suicide.Patient told to call clinic if any problems occur. Patient advised to go to ER if they should develop SI/HI, side effects, or if symptoms worsen. Has crisis numbers to call if needed. Pt verbalized understanding.   Other: F/up in 3 months or sooner if needed.    Oletta Darter, MD 04/05/2016

## 2016-06-21 ENCOUNTER — Ambulatory Visit (HOSPITAL_COMMUNITY): Payer: Self-pay | Admitting: Psychiatry

## 2016-08-14 ENCOUNTER — Encounter (HOSPITAL_BASED_OUTPATIENT_CLINIC_OR_DEPARTMENT_OTHER): Payer: Self-pay | Admitting: *Deleted

## 2016-08-14 ENCOUNTER — Observation Stay (HOSPITAL_BASED_OUTPATIENT_CLINIC_OR_DEPARTMENT_OTHER)
Admission: EM | Admit: 2016-08-14 | Discharge: 2016-08-15 | Disposition: A | Discharge status: Home / Self Care | Payer: BLUE CROSS/BLUE SHIELD | Attending: Internal Medicine | Admitting: Internal Medicine

## 2016-08-14 ENCOUNTER — Emergency Department (HOSPITAL_BASED_OUTPATIENT_CLINIC_OR_DEPARTMENT_OTHER): Payer: BLUE CROSS/BLUE SHIELD

## 2016-08-14 ENCOUNTER — Observation Stay (HOSPITAL_COMMUNITY): Payer: BLUE CROSS/BLUE SHIELD

## 2016-08-14 DIAGNOSIS — Z79899 Other long term (current) drug therapy: Secondary | ICD-10-CM | POA: Insufficient documentation

## 2016-08-14 DIAGNOSIS — D509 Iron deficiency anemia, unspecified: Secondary | ICD-10-CM

## 2016-08-14 DIAGNOSIS — J45902 Unspecified asthma with status asthmaticus: Secondary | ICD-10-CM | POA: Diagnosis present

## 2016-08-14 DIAGNOSIS — J4541 Moderate persistent asthma with (acute) exacerbation: Secondary | ICD-10-CM | POA: Insufficient documentation

## 2016-08-14 DIAGNOSIS — R0603 Acute respiratory distress: Secondary | ICD-10-CM

## 2016-08-14 DIAGNOSIS — R0600 Dyspnea, unspecified: Secondary | ICD-10-CM | POA: Diagnosis not present

## 2016-08-14 DIAGNOSIS — F331 Major depressive disorder, recurrent, moderate: Secondary | ICD-10-CM | POA: Diagnosis present

## 2016-08-14 DIAGNOSIS — J4521 Mild intermittent asthma with (acute) exacerbation: Secondary | ICD-10-CM | POA: Diagnosis not present

## 2016-08-14 DIAGNOSIS — E876 Hypokalemia: Secondary | ICD-10-CM | POA: Insufficient documentation

## 2016-08-14 DIAGNOSIS — F329 Major depressive disorder, single episode, unspecified: Secondary | ICD-10-CM | POA: Insufficient documentation

## 2016-08-14 DIAGNOSIS — F411 Generalized anxiety disorder: Secondary | ICD-10-CM | POA: Diagnosis present

## 2016-08-14 DIAGNOSIS — R000 Tachycardia, unspecified: Secondary | ICD-10-CM | POA: Diagnosis not present

## 2016-08-14 DIAGNOSIS — R740 Nonspecific elevation of levels of transaminase and lactic acid dehydrogenase [LDH]: Secondary | ICD-10-CM | POA: Diagnosis not present

## 2016-08-14 DIAGNOSIS — J4542 Moderate persistent asthma with status asthmaticus: Secondary | ICD-10-CM | POA: Diagnosis not present

## 2016-08-14 DIAGNOSIS — J9601 Acute respiratory failure with hypoxia: Secondary | ICD-10-CM | POA: Diagnosis not present

## 2016-08-14 DIAGNOSIS — J45901 Unspecified asthma with (acute) exacerbation: Secondary | ICD-10-CM | POA: Diagnosis present

## 2016-08-14 LAB — RESPIRATORY PANEL BY PCR
ADENOVIRUS-RVPPCR: NOT DETECTED
Bordetella pertussis: NOT DETECTED
CORONAVIRUS HKU1-RVPPCR: NOT DETECTED
CORONAVIRUS NL63-RVPPCR: NOT DETECTED
CORONAVIRUS OC43-RVPPCR: NOT DETECTED
Chlamydophila pneumoniae: NOT DETECTED
Coronavirus 229E: NOT DETECTED
INFLUENZA A-RVPPCR: NOT DETECTED
Influenza B: NOT DETECTED
METAPNEUMOVIRUS-RVPPCR: NOT DETECTED
Mycoplasma pneumoniae: NOT DETECTED
PARAINFLUENZA VIRUS 1-RVPPCR: NOT DETECTED
PARAINFLUENZA VIRUS 2-RVPPCR: NOT DETECTED
PARAINFLUENZA VIRUS 3-RVPPCR: NOT DETECTED
PARAINFLUENZA VIRUS 4-RVPPCR: NOT DETECTED
RHINOVIRUS / ENTEROVIRUS - RVPPCR: NOT DETECTED
Respiratory Syncytial Virus: NOT DETECTED

## 2016-08-14 LAB — CBC WITH DIFFERENTIAL/PLATELET
BASOS PCT: 1 %
Basophils Absolute: 0.1 10*3/uL (ref 0.0–0.1)
EOS PCT: 10 %
Eosinophils Absolute: 0.8 10*3/uL — ABNORMAL HIGH (ref 0.0–0.7)
HEMATOCRIT: 31.3 % — AB (ref 36.0–46.0)
Hemoglobin: 10 g/dL — ABNORMAL LOW (ref 12.0–15.0)
LYMPHS ABS: 2.1 10*3/uL (ref 0.7–4.0)
Lymphocytes Relative: 26 %
MCH: 23.1 pg — AB (ref 26.0–34.0)
MCHC: 31.9 g/dL (ref 30.0–36.0)
MCV: 72.5 fL — AB (ref 78.0–100.0)
MONO ABS: 0.7 10*3/uL (ref 0.1–1.0)
MONOS PCT: 9 %
NEUTROS ABS: 4.5 10*3/uL (ref 1.7–7.7)
Neutrophils Relative %: 54 %
Platelets: 392 10*3/uL (ref 150–400)
RBC: 4.32 MIL/uL (ref 3.87–5.11)
RDW: 19.3 % — AB (ref 11.5–15.5)
WBC: 8.2 10*3/uL (ref 4.0–10.5)

## 2016-08-14 LAB — BASIC METABOLIC PANEL
Anion gap: 8 (ref 5–15)
BUN: 11 mg/dL (ref 6–20)
CHLORIDE: 107 mmol/L (ref 101–111)
CO2: 24 mmol/L (ref 22–32)
CREATININE: 0.95 mg/dL (ref 0.44–1.00)
Calcium: 9.1 mg/dL (ref 8.9–10.3)
GFR calc Af Amer: >60 mL/min (ref >60)
GFR calc non Af Amer: >60 mL/min (ref >60)
GLUCOSE: 101 mg/dL — AB (ref 65–99)
POTASSIUM: 3.2 mmol/L — AB (ref 3.5–5.1)
Sodium: 139 mmol/L (ref 135–145)

## 2016-08-14 LAB — LACTIC ACID, PLASMA
LACTIC ACID, VENOUS: 3.8 mmol/L — AB (ref 0.5–1.9)
Lactic Acid, Venous: 4.7 mmol/L (ref 0.5–1.9)

## 2016-08-14 LAB — CREATININE, SERUM: CREATININE: 1.15 mg/dL — AB (ref 0.44–1.00)

## 2016-08-14 LAB — CBC
HEMATOCRIT: 30.8 % — AB (ref 36.0–46.0)
Hemoglobin: 9.7 g/dL — ABNORMAL LOW (ref 12.0–15.0)
MCH: 22.4 pg — ABNORMAL LOW (ref 26.0–34.0)
MCHC: 31.5 g/dL (ref 30.0–36.0)
MCV: 71.1 fL — ABNORMAL LOW (ref 78.0–100.0)
PLATELETS: 378 10*3/uL (ref 150–400)
RBC: 4.33 MIL/uL (ref 3.87–5.11)
RDW: 18.8 % — AB (ref 11.5–15.5)
WBC: 11.5 10*3/uL — AB (ref 4.0–10.5)

## 2016-08-14 LAB — PROCALCITONIN: Procalcitonin: <0.1 ng/mL

## 2016-08-14 LAB — MAGNESIUM: Magnesium: 2.2 mg/dL (ref 1.7–2.4)

## 2016-08-14 MED ORDER — DEXTROSE 5 % IV SOLN
500.0000 mg | INTRAVENOUS | Status: DC
  Administered 2016-08-14 – 2016-08-15 (×2): 500 mg via INTRAVENOUS
  Filled 2016-08-14 (×2): qty 500

## 2016-08-14 MED ORDER — ALBUTEROL (5 MG/ML) CONTINUOUS INHALATION SOLN
15.0000 mg | INHALATION_SOLUTION | RESPIRATORY_TRACT | Status: DC
  Administered 2016-08-14: 15 mg via RESPIRATORY_TRACT

## 2016-08-14 MED ORDER — ALBUTEROL SULFATE (2.5 MG/3ML) 0.083% IN NEBU: 2.5000 mg | INHALATION_SOLUTION | RESPIRATORY_TRACT | Status: DC | PRN

## 2016-08-14 MED ORDER — SODIUM CHLORIDE 0.9 % IV SOLN
INTRAVENOUS | Status: DC
  Administered 2016-08-14 – 2016-08-15 (×2): via INTRAVENOUS

## 2016-08-14 MED ORDER — IPRATROPIUM BROMIDE 0.02 % IN SOLN: 0.5000 mg | RESPIRATORY_TRACT | Status: DC

## 2016-08-14 MED ORDER — ALBUTEROL (5 MG/ML) CONTINUOUS INHALATION SOLN
15.0000 mg/h | INHALATION_SOLUTION | RESPIRATORY_TRACT | Status: DC
  Administered 2016-08-14: 15 mg/h via RESPIRATORY_TRACT

## 2016-08-14 MED ORDER — SERTRALINE HCL 100 MG PO TABS
200.0000 mg | ORAL_TABLET | Freq: Every day | ORAL | Status: DC
  Administered 2016-08-14 – 2016-08-15 (×2): 200 mg via ORAL
  Filled 2016-08-14 (×2): qty 2

## 2016-08-14 MED ORDER — ONDANSETRON HCL 4 MG/2ML IJ SOLN
4.0000 mg | Freq: Four times a day (QID) | INTRAMUSCULAR | Status: DC | PRN
  Administered 2016-08-14: 4 mg via INTRAVENOUS
  Filled 2016-08-14: qty 2

## 2016-08-14 MED ORDER — METHYLPREDNISOLONE SODIUM SUCC 125 MG IJ SOLR
80.0000 mg | Freq: Two times a day (BID) | INTRAMUSCULAR | Status: DC
  Administered 2016-08-14 – 2016-08-15 (×3): 80 mg via INTRAVENOUS
  Filled 2016-08-14 (×3): qty 2

## 2016-08-14 MED ORDER — IPRATROPIUM BROMIDE 0.02 % IN SOLN
2.5000 mg | RESPIRATORY_TRACT | Status: AC
  Administered 2016-08-14: 2.5 mg via RESPIRATORY_TRACT

## 2016-08-14 MED ORDER — ACETAMINOPHEN 325 MG PO TABS: 650.0000 mg | ORAL_TABLET | Freq: Four times a day (QID) | ORAL | Status: DC | PRN

## 2016-08-14 MED ORDER — ENOXAPARIN SODIUM 40 MG/0.4ML ~~LOC~~ SOLN
40.0000 mg | SUBCUTANEOUS | Status: DC
  Administered 2016-08-14: 40 mg via SUBCUTANEOUS
  Filled 2016-08-14 (×2): qty 0.4

## 2016-08-14 MED ORDER — RACEPINEPHRINE HCL 2.25 % IN NEBU
0.5000 mL | INHALATION_SOLUTION | Freq: Once | RESPIRATORY_TRACT | Status: AC
  Administered 2016-08-14: 0.5 mL via RESPIRATORY_TRACT

## 2016-08-14 MED ORDER — ALBUTEROL SULFATE (2.5 MG/3ML) 0.083% IN NEBU: 5.0000 mg | INHALATION_SOLUTION | RESPIRATORY_TRACT | Status: DC | PRN

## 2016-08-14 MED ORDER — ALBUTEROL SULFATE (2.5 MG/3ML) 0.083% IN NEBU
INHALATION_SOLUTION | RESPIRATORY_TRACT | Status: AC
  Administered 2016-08-14: 0.5 mg via RESPIRATORY_TRACT
  Filled 2016-08-14: qty 6

## 2016-08-14 MED ORDER — CEFTRIAXONE SODIUM 1 G IJ SOLR
1.0000 g | INTRAMUSCULAR | Status: DC
  Filled 2016-08-14: qty 10

## 2016-08-14 MED ORDER — ALBUTEROL (5 MG/ML) CONTINUOUS INHALATION SOLN
INHALATION_SOLUTION | RESPIRATORY_TRACT | Status: AC
  Administered 2016-08-14: 15 mg/h via RESPIRATORY_TRACT
  Filled 2016-08-14: qty 20

## 2016-08-14 MED ORDER — ONDANSETRON HCL 4 MG PO TABS: 4.0000 mg | ORAL_TABLET | Freq: Four times a day (QID) | ORAL | Status: DC | PRN

## 2016-08-14 MED ORDER — MAGNESIUM SULFATE 2 GM/50ML IV SOLN
2.0000 g | Freq: Once | INTRAVENOUS | Status: AC
  Administered 2016-08-14: 2 g via INTRAVENOUS
  Filled 2016-08-14: qty 50

## 2016-08-14 MED ORDER — ALBUTEROL SULFATE (2.5 MG/3ML) 0.083% IN NEBU
5.0000 mg | INHALATION_SOLUTION | RESPIRATORY_TRACT | Status: AC
  Administered 2016-08-14: 0.5 mg via RESPIRATORY_TRACT

## 2016-08-14 MED ORDER — DEXTROSE 5 % IV SOLN
1.0000 g | INTRAVENOUS | Status: DC
  Administered 2016-08-14 – 2016-08-15 (×2): 1 g via INTRAVENOUS
  Filled 2016-08-14 (×2): qty 10

## 2016-08-14 MED ORDER — IPRATROPIUM BROMIDE 0.02 % IN SOLN
RESPIRATORY_TRACT | Status: AC
  Administered 2016-08-14: 2.5 mg via RESPIRATORY_TRACT
  Filled 2016-08-14: qty 2.5

## 2016-08-14 MED ORDER — METHYLPREDNISOLONE SODIUM SUCC 125 MG IJ SOLR
125.0000 mg | Freq: Once | INTRAMUSCULAR | Status: AC
  Administered 2016-08-14: 125 mg via INTRAVENOUS
  Filled 2016-08-14: qty 2

## 2016-08-14 MED ORDER — IPRATROPIUM-ALBUTEROL 0.5-2.5 (3) MG/3ML IN SOLN: 3.0000 mL | Freq: Four times a day (QID) | RESPIRATORY_TRACT | Status: DC

## 2016-08-14 MED ORDER — ACETAMINOPHEN 650 MG RE SUPP: 650.0000 mg | Freq: Four times a day (QID) | RECTAL | Status: DC | PRN

## 2016-08-14 MED ORDER — RACEPINEPHRINE HCL 2.25 % IN NEBU
INHALATION_SOLUTION | RESPIRATORY_TRACT | Status: AC
  Filled 2016-08-14: qty 0.5

## 2016-08-14 MED ORDER — IPRATROPIUM-ALBUTEROL 0.5-2.5 (3) MG/3ML IN SOLN
3.0000 mL | RESPIRATORY_TRACT | Status: DC
  Administered 2016-08-14 – 2016-08-15 (×5): 3 mL via RESPIRATORY_TRACT
  Filled 2016-08-14 (×5): qty 3

## 2016-08-14 MED ORDER — PREGABALIN 75 MG PO CAPS
150.0000 mg | ORAL_CAPSULE | Freq: Two times a day (BID) | ORAL | Status: DC
  Administered 2016-08-14 – 2016-08-15 (×3): 150 mg via ORAL
  Filled 2016-08-14 (×3): qty 2

## 2016-08-14 MED ORDER — SODIUM CHLORIDE 0.9 % IV BOLUS (SEPSIS)
1000.0000 mL | Freq: Once | INTRAVENOUS | Status: AC
  Administered 2016-08-14: 1000 mL via INTRAVENOUS

## 2016-08-14 MED ORDER — ONDANSETRON HCL 4 MG/2ML IJ SOLN: 4.0000 mg | Freq: Three times a day (TID) | INTRAMUSCULAR | Status: DC | PRN

## 2016-08-14 MED ORDER — POLYETHYLENE GLYCOL 3350 17 G PO PACK: 17.0000 g | PACK | Freq: Every day | ORAL | Status: DC | PRN

## 2016-08-14 MED ORDER — IPRATROPIUM-ALBUTEROL 0.5-2.5 (3) MG/3ML IN SOLN
RESPIRATORY_TRACT | Status: AC
  Administered 2016-08-14: 3 mL
  Filled 2016-08-14: qty 3

## 2016-08-14 NOTE — ED Triage Notes (Signed)
Pt c/o SOB x 2 hrs HX asthma out of meds

## 2016-08-14 NOTE — Progress Notes (Signed)
CRITICAL VALUE ALERT  Critical value received:  Lactic acid 4.7  Date of notification:  08/14/16  Time of notification:  1156  Critical value read back:Yes.    Nurse who received alert:  Madelin RearLonnie Indy Prestwood   MD notified (1st page):  Isaiah BlakesMarina S Kyazimova, PA-C  Time of first page:  1200  MD notified (2nd page):  Time of second page:  Responding MD:  Isaiah BlakesMarina S Kyazimova, PA-C  Time MD responded:  [phone]

## 2016-08-14 NOTE — ED Provider Notes (Signed)
[facility] Provider Note: Lowella Dell, MD, FACEP  CSN: [id number] MRN: [id number] ARRIVAL: 08/14/16 at 0035 ROOM: MH12/MH12   CHIEF COMPLAINT  Shortness of Breath   HISTORY OF PRESENT ILLNESS  Sylvia Parker is a 31 y.o. female with a history of asthma who is here with shortness of breath that began yesterday evening. She did not get adequate relief with her home albuterol treatment. On arrival she was noted to be wheezing throughout and was given an albuterol and Atrovent treatment which she states did not adequately relieve her symptoms. She continues to have shortness of breath and increased work of breathing. Symptoms are moderate to severe. She denies fever. She denies cough.   Past Medical History:  Diagnosis Date  . Allergy   . Asthma   . Depression     Past Surgical History:  Procedure Laterality Date  . BREAST SURGERY      Family History  Problem Relation Age of Onset  . Kidney failure Father   . Cancer Maternal Grandfather   . Cancer Paternal Grandfather   . Depression Mother   . Suicidality Neg Hx   . Seizures Neg Hx   . Schizophrenia Neg Hx   . Anxiety disorder Neg Hx     Social History  Substance Use Topics  . Smoking status: Never Smoker  . Smokeless tobacco: Never Used  . Alcohol use Yes     Comment: occ- 2x/month 1-2 drinks per episode    Prior to Admission medications   Medication Sig Start Date End Date Taking? Authorizing Provider  albuterol (PROVENTIL HFA;VENTOLIN HFA) 108 (90 Base) MCG/ACT inhaler Inhale 1-2 puffs into the lungs every 6 (six) hours as needed for wheezing or shortness of breath. 07/05/15   Cheri Fowler, PA-C  dextromethorphan-guaiFENesin (MUCINEX DM) 30-600 MG 12hr tablet Take 1 tablet by mouth 2 (two) times daily. 03/31/16   Maryann Mikhail, DO  methocarbamol (ROBAXIN) 500 MG tablet Take 2 tablets (1,000 mg total) by mouth 4 (four) times daily as needed (Pain). 02/13/15   Nicole Pisciotta, PA-C    mirtazapine (REMERON) 30 MG tablet Take 1 tablet (30 mg total) by mouth at bedtime. 04/05/16 04/05/17  Oletta Darter, MD  predniSONE (DELTASONE) 20 MG tablet Take  60mg  (3 tabs) x 3 days, then taper to 40mg  (2 tabs) x 3 days, then 20mg  (1 tab) x 3days 03/31/16   Maryann Mikhail, DO  pregabalin (LYRICA) 150 MG capsule Take 150 mg by mouth 2 (two) times daily.    Historical Provider, MD  sertraline (ZOLOFT) 100 MG tablet Take 2 tablets (200 mg total) by mouth every morning. 04/05/16   Oletta Darter, MD    Allergies Patient has no known allergies.   REVIEW OF SYSTEMS  Negative except as noted here or in the History of Present Illness.   PHYSICAL EXAMINATION  Initial Vital Signs Blood pressure 140/90, pulse 119, temperature 97.7 F (36.5 C), temperature source Oral, resp. rate 24, height 5\' 1"  (1.549 m), weight 192 lb (87.1 kg), last menstrual period 07/31/2016, SpO2 97 %.  Examination General: Well-developed, well-nourished female in no acute distress; appearance consistent with age of record HENT: normocephalic; atraumatic Eyes: pupils equal, round and reactive to light; extraocular muscles intact Neck: supple Heart: regular rate and rhythm Lungs: Stridor; decreased air movement bilaterally; tachypnea Abdomen: soft; nondistended; nontender; bowel sounds present Extremities: No deformity; full range of motion; pulses normal Neurologic: Awake, alert and oriented; motor function intact in all extremities and symmetric; no facial  droop Skin: Warm and dry Psychiatric: Anxious   RESULTS  Summary of this visit's results, reviewed by myself:   EKG Interpretation  Date/Time:    Ventricular Rate:    PR Interval:    QRS Duration:   QT Interval:    QTC Calculation:   R Axis:     Text Interpretation:        Laboratory Studies: Results for orders placed or performed during the hospital encounter of 08/14/16 (from the past 24 hour(s))  Basic metabolic panel     Status: Abnormal    Collection Time: 08/14/16  1:15 AM  Result Value Ref Range   Sodium 139 135 - 145 mmol/L   Potassium 3.2 (L) 3.5 - 5.1 mmol/L   Chloride 107 101 - 111 mmol/L   CO2 24 22 - 32 mmol/L   Glucose, Bld 101 (H) 65 - 99 mg/dL   BUN 11 6 - 20 mg/dL   Creatinine, Ser 1.610.95 0.44 - 1.00 mg/dL   Calcium 9.1 8.9 - 09.610.3 mg/dL   GFR calc non Af Amer >60 >60 mL/min   GFR calc Af Amer >60 >60 mL/min   Anion gap 8 5 - 15  CBC with Differential/Platelet     Status: Abnormal   Collection Time: 08/14/16  1:15 AM  Result Value Ref Range   WBC 8.2 4.0 - 10.5 K/uL   RBC 4.32 3.87 - 5.11 MIL/uL   Hemoglobin 10.0 (L) 12.0 - 15.0 g/dL   HCT 04.531.3 (L) 40.936.0 - 81.146.0 %   MCV 72.5 (L) 78.0 - 100.0 fL   MCH 23.1 (L) 26.0 - 34.0 pg   MCHC 31.9 30.0 - 36.0 g/dL   RDW 91.419.3 (H) 78.211.5 - 95.615.5 %   Platelets 392 150 - 400 K/uL   Neutrophils Relative % 54 %   Lymphocytes Relative 26 %   Monocytes Relative 9 %   Eosinophils Relative 10 %   Basophils Relative 1 %   Neutro Abs 4.5 1.7 - 7.7 K/uL   Lymphs Abs 2.1 0.7 - 4.0 K/uL   Monocytes Absolute 0.7 0.1 - 1.0 K/uL   Eosinophils Absolute 0.8 (H) 0.0 - 0.7 K/uL   Basophils Absolute 0.1 0.0 - 0.1 K/uL   RBC Morphology TARGET CELLS    Imaging Studies: Dg Chest 2 View  Result Date: 08/14/2016 CLINICAL DATA:  Asthma with shortness of breath EXAM: CHEST  2 VIEW COMPARISON:  03/29/2016 FINDINGS: The heart size and mediastinal contours are within normal limits. Both lungs are clear. The visualized skeletal structures are unremarkable. IMPRESSION: No active cardiopulmonary disease. Electronically Signed   By: Jasmine PangKim  Fujinaga M.D.   On: 08/14/2016 01:30    ED COURSE  Nursing notes and initial vitals signs, including pulse oximetry, reviewed.  Vitals:   08/14/16 0043 08/14/16 0044 08/14/16 0100 08/14/16 0402  BP:  140/90  128/89  Pulse:  119  100  Resp:  24  (!) 28  Temp:  97.7 F (36.5 C)  97.9 F (36.6 C)  TempSrc:  Oral  Tympanic  SpO2:  93% 97% 92%  Weight: 192 lb  (87.1 kg)     Height: 5\' 1"  (1.549 m)      4:27 AM Stridor and work of breathing improved after racemic appendectomy neb treatment. Wheezing is heard throughout all lung fields. We will try a continuous albuterol neb treatment. She was also given Solu-Medrol IV.  5:46 AM Wheezing and increased work of breathing continues despite continuous neb treatment. We will have the  patient admitted for persistent asthma.  PROCEDURES    ED DIAGNOSES     ICD-9-CM ICD-10-CM   1. Moderate persistent asthma with status asthmaticus 493.91 J45.42        Paula Libra, MD 08/14/16 [phone]

## 2016-08-14 NOTE — H&P (Signed)
History and Physical    Sylvia Parker ZOX:[id number] DOB: 13-Dec-1985 DOA: 08/14/2016   PCP: Mauricia Area, MD  Patient coming from: Home  Chief Complaint: Progressive SOB and wheezing  HPI: Sylvia Parker is a 31 y.o. female with medical history significant of chronic asthma, generalized anxiety disorder and major depression who was seen at [facility] with c/o increasing SOB. Onset of symptoms was last night. Patient tried her home albuterol teatment without tangible improvement and arrived to the ED in severe respiratory distress. She denied chest pain, nausea,  vomiting, dysuria, hematuria, sore throat, runny nose  ED Course: patient was tachypneic and tachycardic on presentation, but afebrile. WBC were WNL and chest Xray did not show any acute pulmonary disease Despite supplemental O2, nebulizer treatment and IV steroid patient was still in respiratory distress and transferred to the [facility] as a direct admission    Review of Systems: As per HPI otherwise 10 point review of systems negative.   Ambulatory Status: Independent with all ADL  Past Medical History:  Diagnosis Date  . Allergy   . Asthma   . Depression     Past Surgical History:  Procedure Laterality Date  . BREAST SURGERY      Social History   Social History  . Marital status: Single    Spouse name: N/A  . Number of children: N/A  . Years of education: N/A   Occupational History  . Not on file.   Social History Main Topics  . Smoking status: Never Smoker  . Smokeless tobacco: Never Used  . Alcohol use Yes     Comment: occ- 2x/month 1-2 drinks per episode  . Drug use: Yes    Types: Marijuana     Comment: smoking THC multiple times a day  . Sexual activity: Not on file   Other Topics Concern  . Not on file   Social History Narrative  . No narrative on file    No Known Allergies  Family History  Problem Relation Age of Onset  . Kidney failure Father   .  Cancer Maternal Grandfather   . Cancer Paternal Grandfather   . Depression Mother   . Suicidality Neg Hx   . Seizures Neg Hx   . Schizophrenia Neg Hx   . Anxiety disorder Neg Hx     Prior to Admission medications   Medication Sig Start Date End Date Taking? Authorizing Provider  albuterol (PROVENTIL HFA;VENTOLIN HFA) 108 (90 Base) MCG/ACT inhaler Inhale 1-2 puffs into the lungs every 6 (six) hours as needed for wheezing or shortness of breath. 07/05/15   Cheri Fowler, PA-C  dextromethorphan-guaiFENesin (MUCINEX DM) 30-600 MG 12hr tablet Take 1 tablet by mouth 2 (two) times daily. 03/31/16   Maryann Mikhail, DO  methocarbamol (ROBAXIN) 500 MG tablet Take 2 tablets (1,000 mg total) by mouth 4 (four) times daily as needed (Pain). 02/13/15   Nicole Pisciotta, PA-C  mirtazapine (REMERON) 30 MG tablet Take 1 tablet (30 mg total) by mouth at bedtime. 04/05/16 04/05/17  Oletta Darter, MD  predniSONE (DELTASONE) 20 MG tablet Take  60mg  (3 tabs) x 3 days, then taper to 40mg  (2 tabs) x 3 days, then 20mg  (1 tab) x 3days 03/31/16   Maryann Mikhail, DO  pregabalin (LYRICA) 150 MG capsule Take 150 mg by mouth 2 (two) times daily.    Historical Provider, MD  sertraline (ZOLOFT) 100 MG tablet Take 2 tablets (200 mg total) by mouth every morning. 04/05/16   Theadora Rama  Michae KavaAgarwal, MD    Physical Exam: Vitals:   08/14/16 [id number] 08/14/16 0758 08/14/16 0844 08/14/16 0850  BP: 131/74   93/74  Pulse: 115 103  (!) 104  Resp: 24   (!) 22  Temp:    98.2 F (36.8 C)  TempSrc:    Oral  SpO2: 94% 97% 98% 100%  Weight:    84.7 kg (186 lb 11.2 oz)  Height:    5\' 1"  (1.549 m)     General: Appears calm and comfortable Eyes: PERRLA, EOMI, normal lids, iris ENT:  grossly normal hearing, lips & tongue, mucous membranes moist and intact Neck: no lymphoadenopathy, masses or thyromegaly Cardiovascular: RRR, no m/r/g. No JVD, carotid bruits. No LE edema.  Respiratory: bilateral wheezes, rales and rhonchi with overall poor air  movement. Normal respiratory effort. No accessory muscle use observed Abdomen: soft, non-tender, non-distended, no organomegaly or masses appreciated. BS present in all quadrants Skin: no rash, ulcers or induration seen on limited exam Musculoskeletal: grossly normal tone BUE/BLE, good ROM, no bony abnormality or joint deformities observed Psychiatric: grossly normal mood and affect, speech fluent and appropriate, alert and oriented x3 Neurologic: CN II-XII grossly intact, moves all extremities in coordinated fashion, sensation intact  Labs on Admission: I have personally reviewed following labs and imaging studies  CBC, BMP  GFR: Estimated Creatinine Clearance: 84.8 mL/min (by C-G formula based on SCr of 0.95 mg/dL).   Creatinine Clearance: Estimated Creatinine Clearance: 84.8 mL/min (by C-G formula based on SCr of 0.95 mg/dL).   Radiological Exams on Admission: Dg Chest 2 View  Result Date: 08/14/2016 CLINICAL DATA:  Asthma with shortness of breath EXAM: CHEST  2 VIEW COMPARISON:  03/29/2016 FINDINGS: The heart size and mediastinal contours are within normal limits. Both lungs are clear. The visualized skeletal structures are unremarkable. IMPRESSION: No active cardiopulmonary disease. Electronically Signed   By: Jasmine PangKim  Fujinaga M.D.   On: 08/14/2016 01:30    EKG: not found   Assessment/Plan Principal Problem:   Acute respiratory failure with hypoxia (HCC) Active Problems:   MDD (major depressive disorder), recurrent episode, moderate (HCC)   GAD (generalized anxiety disorder)   Asthma exacerbation   Status asthmaticus   Acute respiratory failure d/t asthma exacerbation with status asthmaticus - initial SpO2 dropped to 90% on RA despite sup[plemental O2; currently SpO2 improved to 98-100% after multiple nebulizer treatments with bronchodilators and racepinephrine Patient reported having a fever of 103F on Saturday (08/11/2016), but denied flu like symptoms - acute respiratory  infection could not be ruled out completely Will repeat chest XRay, check Procalcitonin, lactic acid, add IV antibiotics, continue IV steroid and nebulizer treatment around the clock and as needed q 2 hours   Depression with generalized anxiety disorder - continue home dose Zoloft   DVT prophylaxis: Lovenox Code Status: Full  Family Communication: at bedside Disposition Plan: MedSurg Consults called: none Admission status: abservation    Raymon MuttonMarina Karthik Whittinghill, PA-C Pager: [phone] [facility]  If 7PM-7AM, please contact night-coverage [email or web address] Password [email or web address]  08/14/2016, 10:08 AM

## 2016-08-14 NOTE — ED Notes (Signed)
ED Provider at bedside. 

## 2016-08-14 NOTE — Progress Notes (Signed)
Patient vomited after receiving IV zithromax. MD made aware. Will continue to monitor. Madelin RearLonnie Iveliz Garay, MSN, RN, [employer]RN

## 2016-08-15 DIAGNOSIS — J9601 Acute respiratory failure with hypoxia: Secondary | ICD-10-CM | POA: Diagnosis not present

## 2016-08-15 DIAGNOSIS — J4521 Mild intermittent asthma with (acute) exacerbation: Secondary | ICD-10-CM | POA: Diagnosis not present

## 2016-08-15 DIAGNOSIS — J45901 Unspecified asthma with (acute) exacerbation: Secondary | ICD-10-CM

## 2016-08-15 DIAGNOSIS — D509 Iron deficiency anemia, unspecified: Secondary | ICD-10-CM

## 2016-08-15 DIAGNOSIS — R0603 Acute respiratory distress: Secondary | ICD-10-CM

## 2016-08-15 DIAGNOSIS — R000 Tachycardia, unspecified: Secondary | ICD-10-CM

## 2016-08-15 LAB — BASIC METABOLIC PANEL
Anion gap: 11 (ref 5–15)
BUN: 5 mg/dL — ABNORMAL LOW (ref 6–20)
CHLORIDE: 108 mmol/L (ref 101–111)
CO2: 20 mmol/L — ABNORMAL LOW (ref 22–32)
CREATININE: 0.87 mg/dL (ref 0.44–1.00)
Calcium: 9 mg/dL (ref 8.9–10.3)
GFR calc non Af Amer: >60 mL/min (ref >60)
Glucose, Bld: 108 mg/dL — ABNORMAL HIGH (ref 65–99)
Potassium: 4.1 mmol/L (ref 3.5–5.1)
Sodium: 139 mmol/L (ref 135–145)

## 2016-08-15 LAB — CBC
HCT: 30.3 % — ABNORMAL LOW (ref 36.0–46.0)
Hemoglobin: 9.5 g/dL — ABNORMAL LOW (ref 12.0–15.0)
MCH: 22.5 pg — ABNORMAL LOW (ref 26.0–34.0)
MCHC: 31.4 g/dL (ref 30.0–36.0)
MCV: 71.8 fL — AB (ref 78.0–100.0)
PLATELETS: 392 10*3/uL (ref 150–400)
RBC: 4.22 MIL/uL (ref 3.87–5.11)
RDW: 19.1 % — ABNORMAL HIGH (ref 11.5–15.5)
WBC: 13.9 10*3/uL — AB (ref 4.0–10.5)

## 2016-08-15 MED ORDER — AMOXICILLIN 875 MG PO TABS: 875.0000 mg | ORAL_TABLET | Freq: Two times a day (BID) | ORAL | 0 refills | Status: AC

## 2016-08-15 MED ORDER — AZITHROMYCIN 500 MG PO TABS: 500.0000 mg | ORAL_TABLET | Freq: Every day | ORAL | 0 refills | Status: AC

## 2016-08-15 MED ORDER — PREDNISONE 10 MG PO TABS: ORAL_TABLET | ORAL | 0 refills | Status: AC

## 2016-08-15 MED ORDER — IPRATROPIUM-ALBUTEROL 0.5-2.5 (3) MG/3ML IN SOLN
3.0000 mL | Freq: Four times a day (QID) | RESPIRATORY_TRACT | Status: DC
  Administered 2016-08-15: 3 mL via RESPIRATORY_TRACT
  Filled 2016-08-15: qty 3

## 2016-08-15 NOTE — Progress Notes (Signed)
Patient discharge instructions reviewed and patient verbalized understanding of her instruction. Prescriptions faxed over to Pharmacy.IVremoved and catheter tip intact

## 2016-08-15 NOTE — Discharge Summary (Signed)
Physician Discharge Summary  Sylvia MinisterKarren Thurman Parker ZOX:[id number] DOB: [birth year]  PCP: Mauricia AreaSELTZER, BARRY R, MD  Admit date: 08/14/2016 Discharge date: 08/15/2016   Recommendations for Outpatient Follow-up:  1. Dr. Olga MillersBarry Seltzer, PCP in 5 days with repeat labs (CBC & BMP).  2. Recommend repeating chest x-ray in 3-4 weeks.    Home Health: None Equipment/Devices: None    Discharge Condition: Improved and stable.  CODE STATUS: Full  Diet recommendation: Heart healthy diet.  Discharge Diagnoses:  Principal Problem:   Acute respiratory failure with hypoxia (HCC) Active Problems:   MDD (major depressive disorder), recurrent episode, moderate (HCC)   GAD (generalized anxiety disorder)   Asthma exacerbation   Status asthmaticus   Microcytic anemia   Respiratory distress   Tachycardia   Brief/Interim Summary: 31 year old female with PMH of allergy, asthma and depression, works from home, seen at [facility] for complaints of worsening dyspnea which started the night prior to admission. She tried home albuterol inhaler without significant relief and hence presented to the ED with severe respiratory distress. She denied runny nose, sore throat, headache, fever, chills, chest pain, nausea, vomiting or dysuria. In the ED, she was tachypneic and tachycardic on presentation but afebrile. No leukocytosis and chest x-ray showed no acute disease. Despite supplemental oxygen, bronchodilator nebulizations and IV steroids, she remained in respiratory distress and hence [facility].  Assessment and plan:  1. Asthma exacerbation: History as indicated above. Admitted to medical floor. Treated with oxygen, bronchodilator nebulizations, IV steroids and empiric IV ceftriaxone and azithromycin. She clinically improved and was able to come off of oxygen for almost 24 hours prior to discharge. She was transitioned to oral prednisone taper and complete course of antibiotics  at discharge. She states that she uses her albuterol inhaler couple times a week and at times daily. Recommended outpatient pulmonology consultation to consider maintenance medications. She verbalized understanding. Asthma exacerbation resolved. RSV panel negative. 2. Possible right mid lung community-acquired pneumonia: Initial chest x-ray did not show any acute findings. Follow-up chest x-ray on 2/6 showed subtle atelectasis or infiltrate in the right mid lung more conspicuous than on the earlier study. She was treated in the hospital with IV azithromycin and ceftriaxone 2 days. She was discharged on oral amoxicillin and azithromycin to complete total 7 days treatment. Recommend follow-up chest x-ray in 3-4 weeks to ensure resolution of pneumonia findings. 3. Acute respiratory failure with hypoxia: Secondary to asthma exacerbation and pneumonia. Initial oxygen saturation dropped to 90% on room air despite supplemental oxygen. She was treated as above. Hypoxia resolved. 4. Depression & generalized anxiety disorder: Stable without suicidal or homicidal ideations. Continue Zoloft and Remeron. 5. Hypokalemia: Replaced. Magnesium 2.2. 6. Microcytic anemia: Stable. May be related to menstrual blood loss. Outpatient follow-up. 7. Elevated lactate: Likely secondary to acute respiratory failure from asthma exacerbation.    Consultations  None  Procedures  None    Discharge Instructions  Discharge Instructions    Call MD for:  difficulty breathing, headache or visual disturbances    Complete by:  As directed    Call MD for:  extreme fatigue    Complete by:  As directed    Call MD for:  persistant dizziness or light-headedness    Complete by:  As directed    Call MD for:  severe uncontrolled pain    Complete by:  As directed    Call MD for:  temperature >100.4    Complete by:  As directed  Diet - low sodium heart healthy    Complete by:  As directed    Increase activity slowly     Complete by:  As directed        Medication List    TAKE these medications   albuterol 108 (90 Base) MCG/ACT inhaler Commonly known as:  PROVENTIL HFA;VENTOLIN HFA Inhale 1-2 puffs into the lungs every 6 (six) hours as needed for wheezing or shortness of breath.   amoxicillin 875 MG tablet Commonly known as:  AMOXIL Take 1 tablet (875 mg total) by mouth 2 (two) times daily. Start taking on:  08/16/2016   azithromycin 500 MG tablet Commonly known as:  ZITHROMAX Take 1 tablet (500 mg total) by mouth daily. Start taking on:  08/16/2016   ipratropium-albuterol 0.5-2.5 (3) MG/3ML Soln Commonly known as:  DUONEB Inhale 3 mLs into the lungs 4 (four) times daily as needed.   mirtazapine 30 MG tablet Commonly known as:  REMERON Take 1 tablet (30 mg total) by mouth at bedtime.   naproxen sodium 220 MG tablet Commonly known as:  ANAPROX Take 660 mg by mouth daily as needed (headache).   predniSONE 10 MG tablet Commonly known as:  DELTASONE Take 4 tabs daily for 3 days, then 3 tabs daily for 3 days, then 2 tabs daily for 3 days, then 1 tab daily for 3 days, then stop.   pregabalin 150 MG capsule Commonly known as:  LYRICA Take 150 mg by mouth 2 (two) times daily.   sertraline 100 MG tablet Commonly known as:  ZOLOFT Take 2 tablets (200 mg total) by mouth every morning.      Follow-up Information    SELTZER, Damian Leavell, MD. Schedule an appointment as soon as possible for a visit in 5 day(s).   Specialty:  Oncology Why:  To be seen with repeat labs (CBC & BMP). Recommend repeating chest x-ray in 3-4 weeks. Contact information: [address] [place] [address] [phone]          No Known Allergies     Procedures/Studies: Dg Chest 2 View  Result Date: 08/14/2016 CLINICAL DATA:  Shortness of breath and wheezing. Patient currently hospitalized undergoing treatment for asthma attack. Nonsmoker. EXAM: CHEST  2 VIEW COMPARISON:  Chest x-ray August 14, 2016 at 1:18 a.m. FINDINGS: The lungs are well-expanded. There is no alveolar infiltrate. There is subtle increased density in the right perihilar region more conspicuous than on the previous study. The heart and pulmonary vascularity are normal. There is no pleural effusion. The bony thorax is unremarkable. IMPRESSION: Subtle atelectasis or infiltrate in the right mid lung more conspicuous than on the earlier study. No infiltrates elsewhere. Electronically Signed   By: David  [place] M.D.   On: 08/14/2016 12:01   Dg Chest 2 View  Result Date: 08/14/2016 CLINICAL DATA:  Asthma with shortness of breath EXAM: CHEST  2 VIEW COMPARISON:  03/29/2016 FINDINGS: The heart size and mediastinal contours are within normal limits. Both lungs are clear. The visualized skeletal structures are unremarkable. IMPRESSION: No active cardiopulmonary disease. Electronically Signed   By: Jasmine Pang M.D.   On: 08/14/2016 01:30      Subjective: Seen on morning of discharge. Denied complaints. No dyspnea since afternoon of 2/6 and has been off of oxygen since then. Mild intermittent dry cough. No chest pain. No dizziness or lightheadedness. Ambulating comfortably in the halls. As per RN, no acute issues. Patient anxious to go home and fearful that she  may lose her job.   Discharge Exam:  Vitals:   08/14/16 2358 08/15/16 0659 08/15/16 0741 08/15/16 1421  BP:  124/66  115/71  Pulse:  94  92  Resp:  20  18  Temp:  99.3 F (37.4 C)  98.6 F (37 C)  TempSrc:  Oral  Oral  SpO2: 96% 99% 97% 98%  Weight:      Height:        General: Pt lying comfortably in bed & appears in no obvious distress. Cardiovascular: S1 & S2 heard, RRR, S1/S2 +. No murmurs, rubs, gallops or clicks. No JVD or pedal edema. Respiratory: Clear to auscultation without wheezing, rhonchi or crackles. No increased work of breathing. Abdominal:  Non distended, non tender & soft. No organomegaly or masses appreciated. Normal bowel sounds heard. CNS:  Alert and oriented. No focal deficits. Extremities: no edema, no cyanosis    The results of significant diagnostics from this hospitalization (including imaging, microbiology, ancillary and laboratory) are listed below for reference.     Microbiology: Recent Results (from the past 240 hour(s))  Respiratory Panel by PCR     Status: None   Collection Time: 08/14/16  1:12 PM  Result Value Ref Range Status   Adenovirus NOT DETECTED NOT DETECTED Final   Coronavirus 229E NOT DETECTED NOT DETECTED Final   Coronavirus HKU1 NOT DETECTED NOT DETECTED Final   Coronavirus NL63 NOT DETECTED NOT DETECTED Final   Coronavirus OC43 NOT DETECTED NOT DETECTED Final   Metapneumovirus NOT DETECTED NOT DETECTED Final   Rhinovirus / Enterovirus NOT DETECTED NOT DETECTED Final   Influenza A NOT DETECTED NOT DETECTED Final   Influenza B NOT DETECTED NOT DETECTED Final   Parainfluenza Virus 1 NOT DETECTED NOT DETECTED Final   Parainfluenza Virus 2 NOT DETECTED NOT DETECTED Final   Parainfluenza Virus 3 NOT DETECTED NOT DETECTED Final   Parainfluenza Virus 4 NOT DETECTED NOT DETECTED Final   Respiratory Syncytial Virus NOT DETECTED NOT DETECTED Final   Bordetella pertussis NOT DETECTED NOT DETECTED Final   Chlamydophila pneumoniae NOT DETECTED NOT DETECTED Final   Mycoplasma pneumoniae NOT DETECTED NOT DETECTED Final     Labs:  Basic Metabolic Panel:  Recent Labs Lab 08/14/16 0115 08/14/16 1041 08/15/16 0615  NA 139  --  139  K 3.2*  --  4.1  CL 107  --  108  CO2 24  --  20*  GLUCOSE 101*  --  108*  BUN 11  --  5*  CREATININE 0.95 1.15* 0.87  CALCIUM 9.1  --  9.0  MG  --  2.2  --     CBC:  Recent Labs Lab 08/14/16 0115 08/14/16 1041 08/15/16 0615  WBC 8.2 11.5* 13.9*  NEUTROABS 4.5  --   --   HGB 10.0* 9.7* 9.5*  HCT 31.3* 30.8* 30.3*  MCV 72.5* 71.1* 71.8*  PLT 392 378 392     Time coordinating discharge: Over 30 minutes  SIGNED:  Marcellus Scott, MD, FACP, FHM. [facility] Pager [phone]  If 7PM-7AM, please contact night-coverage [email or web address] Password [email or web address] 08/15/2016, 2:56 PM

## 2016-08-15 NOTE — Discharge Instructions (Signed)
Asthma, Adult Asthma is a recurring condition in which the airways tighten and narrow. Asthma can make it difficult to breathe. It can cause coughing, wheezing, and shortness of breath. Asthma episodes, also called asthma attacks, range from minor to life-threatening. Asthma cannot be cured, but medicines and lifestyle changes can help control it. What are the causes? Asthma is believed to be caused by inherited (genetic) and environmental factors, but its exact cause is unknown. Asthma may be triggered by allergens, lung infections, or irritants in the air. Asthma triggers are different for each person. Common triggers include:  Animal dander.  Dust mites.  Cockroaches.  Pollen from trees or grass.  Mold.  Smoke.  Air pollutants such as dust, household cleaners, hair sprays, aerosol sprays, paint fumes, strong chemicals, or strong odors.  Cold air, weather changes, and winds (which increase molds and pollens in the air).  Strong emotional expressions such as crying or laughing hard.  Stress.  Certain medicines (such as aspirin) or types of drugs (such as beta-blockers).  Sulfites in foods and drinks. Foods and drinks that may contain sulfites include dried fruit, potato chips, and sparkling grape juice.  Infections or inflammatory conditions such as the flu, a cold, or an inflammation of the nasal membranes (rhinitis).  Gastroesophageal reflux disease (GERD).  Exercise or strenuous activity. What are the signs or symptoms? Symptoms may occur immediately after asthma is triggered or many hours later. Symptoms include:  Wheezing.  Excessive nighttime or early morning coughing.  Frequent or severe coughing with a common cold.  Chest tightness.  Shortness of breath. How is this diagnosed? The diagnosis of asthma is made by a review of your medical history and a physical exam. Tests may also be performed. These may include:  Lung function studies. These tests show how  much air you breathe in and out.  Allergy tests.  Imaging tests such as X-rays. How is this treated? Asthma cannot be cured, but it can usually be controlled. Treatment involves identifying and avoiding your asthma triggers. It also involves medicines. There are 2 classes of medicine used for asthma treatment:  Controller medicines. These prevent asthma symptoms from occurring. They are usually taken every day.  Reliever or rescue medicines. These quickly relieve asthma symptoms. They are used as needed and provide short-term relief. Your health care provider will help you create an asthma action plan. An asthma action plan is a written plan for managing and treating your asthma attacks. It includes a list of your asthma triggers and how they may be avoided. It also includes information on when medicines should be taken and when their dosage should be changed. An action plan may also involve the use of a device called a peak flow meter. A peak flow meter measures how well the lungs are working. It helps you monitor your condition. Follow these instructions at home:  Take medicines only as directed by your health care provider. Speak with your health care provider if you have questions about how or when to take the medicines.  Use a peak flow meter as directed by your health care provider. Record and keep track of readings.  Understand and use the action plan to help minimize or stop an asthma attack without needing to seek medical care.  Control your home environment in the following ways to help prevent asthma attacks:  Do not smoke. Avoid being exposed to secondhand smoke.  Change your heating and air conditioning filter regularly.  Limit your use of fireplaces  and wood stoves.  Get rid of pests (such as roaches and mice) and their droppings.  Throw away plants if you see mold on them.  Clean your floors and dust regularly. Use unscented cleaning products.  Try to have someone  else vacuum for you regularly. Stay out of rooms while they are being vacuumed and for a short while afterward. If you vacuum, use a dust mask from a hardware store, a double-layered or microfilter vacuum cleaner bag, or a vacuum cleaner with a HEPA filter.  Replace carpet with wood, tile, or vinyl flooring. Carpet can trap dander and dust.  Use allergy-proof pillows, mattress covers, and box spring covers.  Wash bed sheets and blankets every week in hot water and dry them in a dryer.  Use blankets that are made of polyester or cotton.  Clean bathrooms and kitchens with bleach. If possible, have someone repaint the walls in these rooms with mold-resistant paint. Keep out of the rooms that are being cleaned and painted.  Wash hands frequently. Contact a health care provider if:  You have wheezing, shortness of breath, or a cough even if taking medicine to prevent attacks.  The colored mucus you cough up (sputum) is thicker than usual.  Your sputum changes from clear or white to yellow, green, gray, or bloody.  You have any problems that may be related to the medicines you are taking (such as a rash, itching, swelling, or trouble breathing).  You are using a reliever medicine more than 2-3 times per week.  Your peak flow is still at 50-79% of your personal best after following your action plan for 1 hour.  You have a fever. Get help right away if:  You seem to be getting worse and are unresponsive to treatment during an asthma attack.  You are short of breath even at rest.  You get short of breath when doing very little physical activity.  You have difficulty eating, drinking, or talking due to asthma symptoms.  You develop chest pain.  You develop a fast heartbeat.  You have a bluish color to your lips or fingernails.  You are light-headed, dizzy, or faint.  Your peak flow is less than 50% of your personal best. This information is not intended to replace advice given to  you by your health care provider. Make sure you discuss any questions you have with your health care provider. Document Released: 06/25/2005 Document Revised: 12/07/2015 Document Reviewed: 01/22/2013 [employer] Interactive Patient Education  2017 [employer]    Community-Acquired Pneumonia, Adult Pneumonia is an infection of the lungs. There are different types of pneumonia. One type can develop while a person is in a hospital. A different type, called community-acquired pneumonia, develops in people who are not, or have not recently been, in the hospital or other health care facility. What are the causes? Pneumonia may be caused by bacteria, viruses, or funguses. Community-acquired pneumonia is often caused by Streptococcus pneumonia bacteria. These bacteria are often passed from one person to another by breathing in droplets from the cough or sneeze of an infected person. What increases the risk? The condition is more likely to develop in:  People who havechronic diseases, such as chronic obstructive pulmonary disease (COPD), asthma, congestive heart failure, cystic fibrosis, diabetes, or kidney disease.  People who haveearly-stage or late-stage HIV.  People who havesickle cell disease.  People who havehad their spleen removed (splenectomy).  People who havepoor Administrator.  People who havemedical conditions that increase the risk of  breathing in (aspirating) secretions their own mouth and nose.  People who havea weakened immune system (immunocompromised).  People who smoke.  People whotravel to areas where pneumonia-causing germs commonly exist.  People whoare around animal habitats or animals that have pneumonia-causing germs, including birds, bats, rabbits, cats, and farm animals. What are the signs or symptoms? Symptoms of this condition include:  Adry cough.  A wet (productive) cough.  Fever.  Sweating.  Chest pain, especially when breathing deeply or  coughing.  Rapid breathing or difficulty breathing.  Shortness of breath.  Shaking chills.  Fatigue.  Muscle aches. How is this diagnosed? Your health care provider will take a medical history and perform a physical exam. You may also have other tests, including:  Imaging studies of your chest, including X-rays.  Tests to check your blood oxygen level and other blood gases.  Other tests on blood, mucus (sputum), fluid around your lungs (pleural fluid), and urine. If your pneumonia is severe, other tests may be done to identify the specific cause of your illness. How is this treated? The type of treatment that you receive depends on many factors, such as the cause of your pneumonia, the medicines you take, and other medical conditions that you have. For most adults, treatment and recovery from pneumonia may occur at home. In some cases, treatment must happen in a hospital. Treatment may include:  Antibiotic medicines, if the pneumonia was caused by bacteria.  Antiviral medicines, if the pneumonia was caused by a virus.  Medicines that are given by mouth or through an IV tube.  Oxygen.  Respiratory therapy. Although rare, treating severe pneumonia may include:  Mechanical ventilation. This is done if you are not breathing well on your own and you cannot maintain a safe blood oxygen level.  Thoracentesis. This procedureremoves fluid around one lung or both lungs to help you breathe better. Follow these instructions at home:  Take over-the-counter and prescription medicines only as told by your health care provider.  Only takecough medicine if you are losing sleep. Understand that cough medicine can prevent your bodys natural ability to remove mucus from your lungs.  If you were prescribed an antibiotic medicine, take it as told by your health care provider. Do not stop taking the antibiotic even if you start to feel better.  Sleep in a semi-upright position at night. Try  sleeping in a reclining chair, or place a few pillows under your head.  Do not use tobacco products, including cigarettes, chewing tobacco, and e-cigarettes. If you need help quitting, ask your health care provider.  Drink enough water to keep your urine clear or pale yellow. This will help to thin out mucus secretions in your lungs. How is this prevented? There are ways that you can decrease your risk of developing community-acquired pneumonia. Consider getting a pneumococcal vaccine if:  You are older than 32 years of age.  You are older than 32 years of age and are undergoing cancer treatment, have chronic lung disease, or have other medical conditions that affect your immune system. Ask your health care provider if this applies to you. There are different types and schedules of pneumococcal vaccines. Ask your health care provider which vaccination option is best for you. You may also prevent community-acquired pneumonia if you take these actions:  Get an influenza vaccine every year. Ask your health care provider which type of influenza vaccine is best for you.  Go to the dentist on a regular basis.  Wash your hands  often. Use hand sanitizer if soap and water are not available. Contact a health care provider if:  You have a fever.  You are losing sleep because you cannot control your cough with cough medicine. Get help right away if:  You have worsening shortness of breath.  You have increased chest pain.  Your sickness becomes worse, especially if you are an older adult or have a weakened immune system.  You cough up blood. This information is not intended to replace advice given to you by your health care provider. Make sure you discuss any questions you have with your health care provider. Document Released: 06/25/2005 Document Revised: 11/03/2015 Document Reviewed: 10/20/2014 [employer] Interactive Patient Education  2017 [employer]   Additional discharge  instructions:  Please get your medications reviewed and adjusted by your Primary MD.  Please request your Primary MD to go over all Hospital Tests and Procedure/Radiological results at the follow up, please get all Hospital records sent to your Prim MD by signing hospital release before you go home.  If you had Pneumonia of Lung problems at the Hospital: Please get a 2 view Chest X ray done in 6-8 weeks after hospital discharge or sooner if instructed by your Primary MD.  If you have Congestive Heart Failure: Please call your Cardiologist or Primary MD anytime you have any of the following symptoms:  1) 3 pound weight gain in 24 hours or 5 pounds in 1 week  2) shortness of breath, with or without a dry hacking cough  3) swelling in the hands, feet or stomach  4) if you have to sleep on extra pillows at night in order to breathe  Follow cardiac low salt diet and 1.5 lit/day fluid restriction.  If you have diabetes Accuchecks 4 times/day, Once in AM empty stomach and then before each meal. Log in all results and show them to your primary doctor at your next visit. If any glucose reading is under 80 or above 300 call your primary MD immediately.  If you have Seizure/Convulsions/Epilepsy: Please do not drive, operate heavy machinery, participate in activities at heights or participate in high speed sports until you have seen by Primary MD or a Neurologist and advised to do so again.  If you had Gastrointestinal Bleeding: Please ask your Primary MD to check a complete blood count within one week of discharge or at your next visit. Your endoscopic/colonoscopic biopsies that are pending at the time of discharge, will also need to followed by your Primary MD.  Get Medicines reviewed and adjusted. Please take all your medications with you for your next visit with your Primary MD  Please request your Primary MD to go over all hospital tests and procedure/radiological results at the follow up,  please ask your Primary MD to get all Hospital records sent to his/her office.  If you experience worsening of your admission symptoms, develop shortness of breath, life threatening emergency, suicidal or homicidal thoughts you must seek medical attention immediately by calling 911 or calling your MD immediately  if symptoms less severe.  You must read complete instructions/literature along with all the possible adverse reactions/side effects for all the Medicines you take and that have been prescribed to you. Take any new Medicines after you have completely understood and accpet all the possible adverse reactions/side effects.   Do not drive or operate heavy machinery when taking Pain medications.   Do not take more than prescribed Pain, Sleep and Anxiety Medications  Special Instructions: If you  have smoked or chewed Tobacco  in the last 2 yrs please stop smoking, stop any regular Alcohol  and or any Recreational drug use.  Wear Seat belts while driving.  Please note You were cared for by a hospitalist during your hospital stay. If you have any questions about your discharge medications or the care you received while you were in the hospital after you are discharged, you can call the unit and asked to speak with the hospitalist on call if the hospitalist that took care of you is not available. Once you are discharged, your primary care physician will handle any further medical issues. Please note that NO REFILLS for any discharge medications will be authorized once you are discharged, as it is imperative that you return to your primary care physician (or establish a relationship with a primary care physician if you do not have one) for your aftercare needs so that they can reassess your need for medications and monitor your lab values.  You can reach the hospitalist office at phone [phone] or fax [phone]   If you do not have a primary care physician, you can call [phone] for a  physician referral.
